# Patient Record
Sex: Male | Born: 1975 | Race: White | Hispanic: No | Marital: Married | Smoking: Current every day smoker
Health system: Southern US, Community
[De-identification: ages and names within clinical notes are randomized; demographics above are authoritative.]

## PROBLEM LIST (undated history)

## (undated) DIAGNOSIS — M1990 Unspecified osteoarthritis, unspecified site: Secondary | ICD-10-CM

## (undated) DIAGNOSIS — J449 Chronic obstructive pulmonary disease, unspecified: Secondary | ICD-10-CM

## (undated) DIAGNOSIS — G629 Polyneuropathy, unspecified: Secondary | ICD-10-CM

## (undated) HISTORY — PX: ABDOMINAL SURGERY: SHX537

---

## 2014-06-24 ENCOUNTER — Emergency Department (HOSPITAL_COMMUNITY)
Admission: EM | Admit: 2014-06-24 | Discharge: 2014-06-24 | Disposition: A | Discharge status: Home / Self Care | Payer: Self-pay | Attending: Emergency Medicine | Admitting: Emergency Medicine

## 2014-06-24 ENCOUNTER — Emergency Department (HOSPITAL_COMMUNITY): Payer: Self-pay

## 2014-06-24 ENCOUNTER — Encounter (HOSPITAL_COMMUNITY): Payer: Self-pay | Admitting: Cardiology

## 2014-06-24 DIAGNOSIS — N2889 Other specified disorders of kidney and ureter: Secondary | ICD-10-CM | POA: Insufficient documentation

## 2014-06-24 DIAGNOSIS — Y9289 Other specified places as the place of occurrence of the external cause: Secondary | ICD-10-CM | POA: Insufficient documentation

## 2014-06-24 DIAGNOSIS — Y9389 Activity, other specified: Secondary | ICD-10-CM | POA: Insufficient documentation

## 2014-06-24 DIAGNOSIS — Y998 Other external cause status: Secondary | ICD-10-CM | POA: Insufficient documentation

## 2014-06-24 DIAGNOSIS — S20222A Contusion of left back wall of thorax, initial encounter: Secondary | ICD-10-CM | POA: Insufficient documentation

## 2014-06-24 DIAGNOSIS — J159 Unspecified bacterial pneumonia: Secondary | ICD-10-CM | POA: Insufficient documentation

## 2014-06-24 DIAGNOSIS — Z720 Tobacco use: Secondary | ICD-10-CM | POA: Insufficient documentation

## 2014-06-24 DIAGNOSIS — J189 Pneumonia, unspecified organism: Secondary | ICD-10-CM

## 2014-06-24 DIAGNOSIS — W19XXXA Unspecified fall, initial encounter: Secondary | ICD-10-CM

## 2014-06-24 DIAGNOSIS — W108XXA Fall (on) (from) other stairs and steps, initial encounter: Secondary | ICD-10-CM | POA: Insufficient documentation

## 2014-06-24 LAB — I-STAT CHEM 8, ED
BUN: 8 mg/dL (ref 6–20)
CHLORIDE: 102 mmol/L (ref 101–111)
Calcium, Ion: 1.15 mmol/L (ref 1.12–1.23)
Creatinine, Ser: 1.1 mg/dL (ref 0.61–1.24)
GLUCOSE: 102 mg/dL — AB (ref 65–99)
HCT: 39 % (ref 39.0–52.0)
Hemoglobin: 13.3 g/dL (ref 13.0–17.0)
Potassium: 4.2 mmol/L (ref 3.5–5.1)
Sodium: 138 mmol/L (ref 135–145)
TCO2: 22 mmol/L (ref 0–100)

## 2014-06-24 MED ORDER — IOHEXOL 300 MG/ML  SOLN
100.0000 mL | Freq: Once | INTRAMUSCULAR | Status: AC | PRN
  Administered 2014-06-24: 100 mL via INTRAVENOUS

## 2014-06-24 MED ORDER — AZITHROMYCIN 250 MG PO TABS: 250.0000 mg | ORAL_TABLET | Freq: Every day | ORAL | Status: DC

## 2014-06-24 MED ORDER — KETOROLAC TROMETHAMINE 30 MG/ML IJ SOLN
60.0000 mg | Freq: Once | INTRAMUSCULAR | Status: AC
  Administered 2014-06-24: 60 mg via INTRAMUSCULAR
  Filled 2014-06-24: qty 2

## 2014-06-24 MED ORDER — NAPROXEN 500 MG PO TABS: 500.0000 mg | ORAL_TABLET | Freq: Two times a day (BID) | ORAL | Status: DC

## 2014-06-24 NOTE — ED Provider Notes (Signed)
CSN: [id number]     Arrival date & time 06/24/14  1038 History  This chart was scribed for non-physician practitioner Jaynie Crumbleatyana Hien Cunliffe, PA-C working with Azalia BilisKevin Campos, MD by Littie Deedsichard Sun, ED Scribe. This patient was seen in room TR07C/TR07C and the patient's care was started at 11:20 AM.     Chief Complaint  Patient presents with  . Fall  . Back Pain   The history is provided by the patient. No language interpreter was used.    HPI Comments: Randy Mann is a 39 y.o. male who presents to the Emergency Department complaining of a fall that occurred about 1.5 hours ago. He reports tripping over an air hose while at work and falling down about 4 steps, hitting his back and tailbone against the steps. He states this knocked the wind out of him. Patient reports having associated throbbing, non-radiating back pain from his mid-back to his lower back. Wearing his tool belt, which weighs about 25 lbs, makes the pain worse. Patient denies headache and SOB.  History reviewed. No pertinent past medical history. History reviewed. No pertinent past surgical history. No family history on file. History  Substance Use Topics  . Smoking status: Current Every Day Smoker  . Smokeless tobacco: Not on file  . Alcohol Use: Yes    Review of Systems  Respiratory: Negative for shortness of breath.   Musculoskeletal: Positive for back pain.  Neurological: Negative for headaches.      Allergies  Review of patient's allergies indicates no known allergies.  Home Medications   Prior to Admission medications   Not on File   BP 117/81 mmHg  Pulse 97  Temp(Src) 98.7 F (37.1 C) (Oral)  Resp 16  Ht 6\' 3"  (1.905 m)  Wt 250 lb (113.399 kg)  BMI 31.25 kg/m2  SpO2 96%   Physical Exam  Constitutional: He is oriented to person, place, and time. He appears well-developed and well-nourished. No distress.  HENT:  Head: Normocephalic and atraumatic.  Mouth/Throat: Oropharynx is clear and moist. No  oropharyngeal exudate.  Eyes: Conjunctivae and EOM are normal. Pupils are equal, round, and reactive to light.  Neck: Neck supple.  Cardiovascular: Normal rate and regular rhythm.   Pulmonary/Chest: Effort normal and breath sounds normal. No respiratory distress. He has no wheezes. He has no rales.  Tenderness to the posterior left lower ribs.  Abdominal: Soft. Bowel sounds are normal. He exhibits no distension. There is tenderness. There is no rebound.  Left upper quadrant tenderness  Musculoskeletal: He exhibits no edema.  Midline tenderness to palpation over lumbar spine midline, there is swelling and bruising to the left lumbar paravertebral area. Tenderness extends into the sacrum and coccyx.  Neurological: He is alert and oriented to person, place, and time. No cranial nerve deficit.  5/5 and equal lower extremity strength. 2+ and equal patellar reflexes bilaterally. Pt able to dorsiflex bilateral toes and feet with good strength against resistance. Equal sensation bilaterally over thighs and lower legs.   Skin: Skin is warm and dry. No rash noted.  Psychiatric: He has a normal mood and affect. His behavior is normal.  Nursing note and vitals reviewed.   ED Course  Procedures  DIAGNOSTIC STUDIES: Oxygen Saturation is 96% on room air, adequate by my interpretation.    COORDINATION OF CARE: 11:25 AM-Discussed treatment plan which includes XR imaging and Toradol injection with patient/guardian at bedside and patient/guardian agreed to plan. Patient declines narcotic pain medication.   Labs Review Labs Reviewed - No  data to display  Imaging Review No results found.   EKG Interpretation None      MDM   Final diagnoses:  Fall, initial encounter  Back contusion, left, initial encounter  CAP (community acquired pneumonia)  Left renal mass    patient is here after a fall down couple of steps. Complaining of left flank pain, left sacrum and coccyx pain, lower back pain. He is  neurovascularly intact. He is also having some left upper quadrant abdominal tenderness. Initially obtained x-rays which are unremarkable. I followed up with a CT scan of abdomen and pelvis given the significant tenderness in the left abdomen and it was worsening with patient being in the ER. His CT is negative for acute injury, however they did see possible pneumonia in the lingula. Also incidental finding of a mass behind left kidney. Discussed results with patient. Patient affect has been coughing for the last week and had a fever up to 101 over the weekend. I will treat her with azithromycin for the pneumonia. Home with NSAIDs, patient does not want anything stronger for pain. Follow-up with primary care doctor.  Filed Vitals:   06/24/14 1058 06/24/14 1412 06/24/14 1521  BP: 117/81 113/60 115/88  Pulse: 97 90 88  Temp: 98.7 F (37.1 C) 97.9 F (36.6 C) 98 F (36.7 C)  TempSrc: Oral Oral   Resp: Height:  (1.905 m)    Weight: 250 lb (113.399 kg)    SpO2: 96% 98% 99%     I personally performed the services described in this documentation, which was scribed in my presence. The recorded information has been reviewed and is accurate.    Jaynie Crumble, PA-C 06/24/14 1621  Azalia Bilis, MD 06/25/14 [phone]

## 2014-06-24 NOTE — Discharge Instructions (Signed)
Your CT scan showed possible pneumonia and a mass behind left kidney. Please take zithromax as prescribed until all gone for this infection. Take naproxen as prescribed as needed for pain. Ice the swollen area of your back several times a day. Please follow with a primary care doctor for recheck and for further evaluation of the mass that was seen on the CT scan.  Hematoma A hematoma is a collection of blood under the skin, in an organ, in a body space, in a joint space, or in other tissue. The blood can clot to form a lump that you can see and feel. The lump is often firm and may sometimes become sore and tender. Most hematomas get better in a few days to weeks. However, some hematomas may be serious and require medical care. Hematomas can range in size from very small to very large. CAUSES  A hematoma can be caused by a blunt or penetrating injury. It can also be caused by spontaneous leakage from a blood vessel under the skin. Spontaneous leakage from a blood vessel is more likely to occur in older people, especially those taking blood thinners. Sometimes, a hematoma can develop after certain medical procedures. SIGNS AND SYMPTOMS   A firm lump on the body.  Possible pain and tenderness in the area.  Bruising.Blue, dark blue, purple-red, or yellowish skin may appear at the site of the hematoma if the hematoma is close to the surface of the skin. For hematomas in deeper tissues or body spaces, the signs and symptoms may be subtle. For example, an intra-abdominal hematoma may cause abdominal pain, weakness, fainting, and shortness of breath. An intracranial hematoma may cause a headache or symptoms such as weakness, trouble speaking, or a change in consciousness. DIAGNOSIS  A hematoma can usually be diagnosed based on your medical history and a physical exam. Imaging tests may be needed if your health care provider suspects a hematoma in deeper tissues or body spaces, such as the abdomen, head, or  chest. These tests may include ultrasonography or a CT scan.  TREATMENT  Hematomas usually go away on their own over time. Rarely does the blood need to be drained out of the body. Large hematomas or those that may affect vital organs will sometimes need surgical drainage or monitoring. HOME CARE INSTRUCTIONS   Apply ice to the injured area:   Put ice in a plastic bag.   Place a towel between your skin and the bag.   Leave the ice on for 20 minutes, 2-3 times a day for the first 1 to 2 days.   After the first 2 days, switch to using warm compresses on the hematoma.   Elevate the injured area to help decrease pain and swelling. Wrapping the area with an elastic bandage may also be helpful. Compression helps to reduce swelling and promotes shrinking of the hematoma. Make sure the bandage is not wrapped too tight.   If your hematoma is on a lower extremity and is painful, crutches may be helpful for a couple days.   Only take over-the-counter or prescription medicines as directed by your health care provider. SEEK IMMEDIATE MEDICAL CARE IF:   You have increasing pain, or your pain is not controlled with medicine.   You have a fever.   You have worsening swelling or discoloration.   Your skin over the hematoma breaks or starts bleeding.   Your hematoma is in your chest or abdomen and you have weakness, shortness of breath, or a  change in consciousness.  Your hematoma is on your scalp (caused by a fall or injury) and you have a worsening headache or a change in alertness or consciousness. MAKE SURE YOU:   Understand these instructions.  Will watch your condition.  Will get help right away if you are not doing well or get worse. Document Released: 09/01/2003 Document Revised: 09/19/2012 Document Reviewed: 06/27/2012 [facility] Patient Information 2015 [place]. This information is not intended to replace advice given to you by your health care provider. Make sure  you discuss any questions you have with your health care provider.   Pneumonia Pneumonia is an infection of the lungs.  CAUSES Pneumonia may be caused by bacteria or a virus. Usually, these infections are caused by breathing infectious particles into the lungs (respiratory tract). SIGNS AND SYMPTOMS   Cough.  Fever.  Chest pain.  Increased rate of breathing.  Wheezing.  Mucus production. DIAGNOSIS  If you have the common symptoms of pneumonia, your health care provider will typically confirm the diagnosis with a chest X-ray. The X-ray will show an abnormality in the lung (pulmonary infiltrate) if you have pneumonia. Other tests of your blood, urine, or sputum may be done to find the specific cause of your pneumonia. Your health care provider may also do tests (blood gases or pulse oximetry) to see how well your lungs are working. TREATMENT  Some forms of pneumonia may be spread to other people when you cough or sneeze. You may be asked to wear a mask before and during your exam. Pneumonia that is caused by bacteria is treated with antibiotic medicine. Pneumonia that is caused by the influenza virus may be treated with an antiviral medicine. Most other viral infections must run their course. These infections will not respond to antibiotics.  HOME CARE INSTRUCTIONS   Cough suppressants may be used if you are losing too much rest. However, coughing protects you by clearing your lungs. You should avoid using cough suppressants if you can.  Your health care provider may have prescribed medicine if he or she thinks your pneumonia is caused by bacteria or influenza. Finish your medicine even if you start to feel better.  Your health care provider may also prescribe an expectorant. This loosens the mucus to be coughed up.  Take medicines only as directed by your health care provider.  Do not smoke. Smoking is a common cause of bronchitis and can contribute to pneumonia. If you are a smoker  and continue to smoke, your cough may last several weeks after your pneumonia has cleared.  A cold steam vaporizer or humidifier in your room or home may help loosen mucus.  Coughing is often worse at night. Sleeping in a semi-upright position in a recliner or using a couple pillows under your head will help with this.  Get rest as you feel it is needed. Your body will usually let you know when you need to rest. PREVENTION A pneumococcal shot (vaccine) is available to prevent a common bacterial cause of pneumonia. This is usually suggested for:  People over 41 years old.  Patients on chemotherapy.  People with chronic lung problems, such as bronchitis or emphysema.  People with immune system problems. If you are over 65 or have a high risk condition, you may receive the pneumococcal vaccine if you have not received it before. In some countries, a routine influenza vaccine is also recommended. This vaccine can help prevent some cases of pneumonia.You may be offered the influenza vaccine  as part of your care. If you smoke, it is time to quit. You may receive instructions on how to stop smoking. Your health care provider can provide medicines and counseling to help you quit. SEEK MEDICAL CARE IF: You have a fever. SEEK IMMEDIATE MEDICAL CARE IF:   Your illness becomes worse. This is especially true if you are elderly or weakened from any other disease.  You cannot control your cough with suppressants and are losing sleep.  You begin coughing up blood.  You develop pain which is getting worse or is uncontrolled with medicines.  Any of the symptoms which initially brought you in for treatment are getting worse rather than better.  You develop shortness of breath or chest pain. MAKE SURE YOU:   Understand these instructions.  Will watch your condition.  Will get help right away if you are not doing well or get worse. Document Released: 01/17/2005 Document Revised: 06/03/2013  Document Reviewed: 04/08/2010 [facility] Patient Information 2015 [place], [place]. This information is not intended to replace advice given to you by your health care provider. Make sure you discuss any questions you have with your health care provider.

## 2014-06-24 NOTE — ED Notes (Signed)
Pt reports he tripped over an air hose this morning and fell down some stairs. Reports lower back pain. States he hit his tailbone on the corner of one step.

## 2015-12-08 ENCOUNTER — Encounter: Payer: Self-pay | Admitting: Emergency Medicine

## 2015-12-08 ENCOUNTER — Emergency Department
Admission: EM | Admit: 2015-12-08 | Discharge: 2015-12-08 | Disposition: A | Discharge status: Home / Self Care | Payer: Self-pay | Attending: Emergency Medicine | Admitting: Emergency Medicine

## 2015-12-08 DIAGNOSIS — Z791 Long term (current) use of non-steroidal anti-inflammatories (NSAID): Secondary | ICD-10-CM | POA: Insufficient documentation

## 2015-12-08 DIAGNOSIS — R600 Localized edema: Secondary | ICD-10-CM | POA: Insufficient documentation

## 2015-12-08 DIAGNOSIS — F17200 Nicotine dependence, unspecified, uncomplicated: Secondary | ICD-10-CM | POA: Insufficient documentation

## 2015-12-08 DIAGNOSIS — M79671 Pain in right foot: Secondary | ICD-10-CM

## 2015-12-08 DIAGNOSIS — M79672 Pain in left foot: Secondary | ICD-10-CM

## 2015-12-08 DIAGNOSIS — Z792 Long term (current) use of antibiotics: Secondary | ICD-10-CM | POA: Insufficient documentation

## 2015-12-08 LAB — GLUCOSE, CAPILLARY: Glucose-Capillary: 122 mg/dL — ABNORMAL HIGH (ref 65–99)

## 2015-12-08 MED ORDER — PREDNISONE 10 MG (21) PO TBPK: 10.0000 mg | ORAL_TABLET | Freq: Every day | ORAL | 0 refills | Status: DC

## 2015-12-08 NOTE — ED Triage Notes (Signed)
Pt reports injured right foot on Friday and is now having swelling to right ankle. Pt reports hit toe on right foot in door jam. Pt reports unable to bear weight on foot. No swelling or deformity noted to right ankle.

## 2015-12-08 NOTE — ED Provider Notes (Signed)
[facility] Emergency [facility] Provider Note   ____________________________________________   I have reviewed the triage vital signs and the nursing notes.   HISTORY  Chief Complaint Bilateral foot pain  History limited by: Not Limited   HPI Randy Mann is a 40 y.o. male who presents to the emergency department today because of concern for bilateral foot pain and swelling. Patient states that these symptoms have been on and off for the past three years. He does not have a primary care doctor. A few days ago the patient stubbed a toe on his right foot. He thinks that the swelling and pain started to get worse on the left foot because he was favoring it, however now he feels he is having swelling on both feet. States he does have a history of gout. The patient denies any fevers, nausea or vomiting.   History reviewed. No pertinent past medical history.  There are no active problems to display for this patient.   Past Surgical History:  Procedure Laterality Date  . ABDOMINAL SURGERY      Prior to Admission medications   Medication Sig Start Date End Date Taking? Authorizing Provider  azithromycin (ZITHROMAX) 250 MG tablet Take 1 tablet (250 mg total) by mouth daily. Take first 2 tablets together, then 1 every day until finished. 06/24/14   Tatyana Kirichenko, PA-C  naproxen (NAPROSYN) 500 MG tablet Take 1 tablet (500 mg total) by mouth 2 (two) times daily. 06/24/14   Jaynie Crumbleatyana Kirichenko, PA-C    Allergies Patient has no known allergies.  No family history on file.  Social History Social History  Substance Use Topics  . Smoking status: Current Every Day Smoker    Packs/day: 1.00  . Smokeless tobacco: Never Used  . Alcohol use Yes    Review of Systems  Constitutional: Negative for fever. Cardiovascular: Negative for chest pain. Respiratory: Negative for shortness of breath. Gastrointestinal: Negative for abdominal pain, vomiting and  diarrhea. Genitourinary: Negative for dysuria. Musculoskeletal: Negative for back pain. Positive for bilateral foot pain. Skin: Negative for rash. Neurological: Negative for headaches, focal weakness or numbness.  10-point ROS otherwise negative.  ____________________________________________   PHYSICAL EXAM:  VITAL SIGNS: ED Triage Vitals  Enc Vitals Group     BP 12/08/15 0455 100/65     Pulse Rate 12/08/15 0455 94     Resp 12/08/15 0455 18     Temp 12/08/15 0455 97.8 F (36.6 C)     Temp Source 12/08/15 0455 Oral     SpO2 12/08/15 0455 96 %     Weight 12/08/15 0455 257 lb (116.6 kg)     Height 12/08/15 0455 6\' 3"  (1.905 m)     Head Circumference --      Peak Flow --      Pain Score 12/08/15 0456 9   Constitutional: Alert and oriented. Well appearing and in no distress. Eyes: Conjunctivae are normal. Normal extraocular movements. ENT   Head: Normocephalic and atraumatic.   Nose: No congestion/rhinnorhea.   Mouth/Throat: Mucous membranes are moist.   Neck: No stridor. Hematological/Lymphatic/Immunilogical: No cervical lymphadenopathy. Cardiovascular: Normal rate, regular rhythm.  No murmurs, rubs, or gallops.  Respiratory: Normal respiratory effort without tachypnea nor retractions. Breath sounds are clear and equal bilaterally. No wheezes/rales/rhonchi. Gastrointestinal: Soft and nontender. No distention.  Genitourinary: Deferred Musculoskeletal: Normal range of motion in all extremities. Trace bilateral pitting edema, some swelling to bilateral feet. Minor. Patient has a slight amount of erythema and warmth over the great PIP  bilaterally. Neurologic:  Normal speech and language. No gross focal neurologic deficits are appreciated.  Skin:  Skin is warm, dry and intact. No rash noted. Psychiatric: Mood and affect are normal. Speech and behavior are normal. Patient exhibits appropriate insight and judgment.  ____________________________________________     LABS (pertinent positives/negatives)  None  ____________________________________________   EKG  None  ____________________________________________    RADIOLOGY  None  ____________________________________________   PROCEDURES  Procedures  ____________________________________________   INITIAL IMPRESSION / ASSESSMENT AND PLAN / ED COURSE  Pertinent labs & imaging results that were available during my care of the patient were reviewed by me and considered in my medical decision making (see chart for details).  Patient with bilateral foot swelling and pain. Chronic issue, although there is some erythema and warmth to PIPs. Will treat presumptively for gout. Additionally discussed importance of primary care - will give clinic info. Discussed return precautions.   ____________________________________________   FINAL CLINICAL IMPRESSION(S) / ED DIAGNOSES  Final diagnoses:  Foot pain, bilateral     Note: This dictation was prepared with Dragon dictation. Any transcriptional errors that result from this process are unintentional    Phineas SemenGraydon Zandrea Kenealy, MD 12/08/15 [id number]

## 2015-12-08 NOTE — ED Notes (Signed)
ED Provider at bedside. 

## 2015-12-08 NOTE — Discharge Instructions (Signed)
Please seek medical attention for any high fevers, chest pain, shortness of breath, change in behavior, persistent vomiting, bloody stool or any other new or concerning symptoms.  

## 2015-12-08 NOTE — ED Notes (Signed)
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  I will run a CBG to check his sugar.  He stated he hit his right foot in the door of his place 2 nights ago and in addition to that pain he is starting to have left ankle pain where he is "compensating for his right foot pain".

## 2015-12-13 ENCOUNTER — Ambulatory Visit (HOSPITAL_COMMUNITY)
Admission: EM | Admit: 2015-12-13 | Discharge: 2015-12-13 | Disposition: A | Discharge status: Home / Self Care | Payer: Self-pay | Attending: Family Medicine | Admitting: Family Medicine

## 2015-12-13 ENCOUNTER — Encounter (HOSPITAL_COMMUNITY): Payer: Self-pay | Admitting: *Deleted

## 2015-12-13 DIAGNOSIS — M10071 Idiopathic gout, right ankle and foot: Secondary | ICD-10-CM

## 2015-12-13 MED ORDER — PREDNISONE 5 MG PO TABS: ORAL_TABLET | ORAL | 0 refills | Status: DC

## 2015-12-13 MED ORDER — KETOROLAC TROMETHAMINE 60 MG/2ML IM SOLN
60.0000 mg | Freq: Once | INTRAMUSCULAR | Status: AC
  Administered 2015-12-13: 60 mg via INTRAMUSCULAR

## 2015-12-13 MED ORDER — KETOROLAC TROMETHAMINE 60 MG/2ML IM SOLN
INTRAMUSCULAR | Status: AC
  Filled 2015-12-13: qty 2

## 2015-12-13 NOTE — ED Triage Notes (Signed)
C/O right foot pain.  Assessment per PA.  CMS intact.

## 2015-12-13 NOTE — Discharge Instructions (Signed)
You probably have gout though other types of artritidies cannot be ruled out, either way prednisone will help your pain and swelling. Must f/u with  a PCP who can investigate this more and help with your long term management. Watch diet that includes BEER, Shellfish or fructose syrup as this can worsen your symtpoms.

## 2015-12-13 NOTE — ED Provider Notes (Signed)
CSN: [id number]     Arrival date & time 12/13/15  1654 History   First MD Initiated Contact with Patient 12/13/15 1804     Chief Complaint  Patient presents with  . Foot Pain   (Consider location/radiation/quality/duration/timing/severity/associated sxs/prior Treatment) [age] yo presents with right ankle and foot pain and swelling. No injury. He carries a history of gout but reports that he does not follow with a PCP and never been treated. He notes that these flares are coming more frequent and they are "Very painful". He has not noticed a food trigger, but a lot of walking and standing may trigger a flare. Prednisone is helpful. He was last seen in the ED and told to wear support hose". He denies fever or chills.       History reviewed. No pertinent past medical history. Past Surgical History:  Procedure Laterality Date  . ABDOMINAL SURGERY     No family history on file. Social History  Substance Use Topics  . Smoking status: Current Every Day Smoker    Packs/day: 1.00  . Smokeless tobacco: Never Used  . Alcohol use 25.2 oz/week    42 Cans of beer per week    Review of Systems  Constitutional: Negative for fatigue and fever.  Musculoskeletal: Positive for arthralgias.    Allergies  Patient has no known allergies.  Home Medications   Prior to Admission medications   Medication Sig Start Date End Date Taking? Authorizing Provider  azithromycin (ZITHROMAX) 250 MG tablet Take 1 tablet (250 mg total) by mouth daily. Take first 2 tablets together, then 1 every day until finished. 06/24/14   Tatyana Kirichenko, PA-C  naproxen (NAPROSYN) 500 MG tablet Take 1 tablet (500 mg total) by mouth 2 (two) times daily. 06/24/14   Tatyana Kirichenko, PA-C  predniSONE (DELTASONE) 5 MG tablet 6 tablets po q d x 2 days, then 5 tabs po x 2 days, then 4 tabs po x 2 days, then 3 tabs po x 2 days, then 2 tabs po x 2 days then 1 tab po x 2 days then stop 12/13/15   Riki SheerMichelle G Valerya Maxton, PA-C   Meds  Ordered and Administered this Visit   Medications  ketorolac (TORADOL) injection 60 mg (60 mg Intramuscular Given 12/13/15 1811)    BP 124/84   Pulse 91   Temp 97.9 F (36.6 C) (Oral)   Resp 16   SpO2 94%  No data found.   Physical Exam  Constitutional: He is oriented to person, place, and time. He appears well-developed and well-nourished. No distress.  Musculoskeletal:  Right lateral ankle and right great toe with erythema, warmth and associated edema, tender to palpation and ROM. No rashes are noted, full ROM  Neurological: He is alert and oriented to person, place, and time.  Skin: Skin is warm. He is not diaphoretic.  Nursing note and vitals reviewed.   Urgent Care Course   Clinical Course     Procedures (including critical care time)  Labs Review Labs Reviewed - No data to display  Imaging Review No results found.   Visual Acuity Review  Right Eye Distance:   Left Eye Distance:   Bilateral Distance:    Right Eye Near:   Left Eye Near:    Bilateral Near:         MDM   1. Acute idiopathic gout of right ankle    Patient's exam and presentation appear suspicious for gout or an inflammatory arthritis. No signs of infection is noted.  Given the severity of pain suspect gout. We discussed long term management with a PCP and how it was crucial to establish care with one. Acutely treat with Prednisone as colchicine is expensive and will have a financial restraint. Toradol is given acutely for pain. Information given on gout and appropriate f/u. F/U as needed.     Riki SheerMichelle G Chai Verdejo, PA-C 12/13/15 1843

## 2016-01-13 ENCOUNTER — Emergency Department (HOSPITAL_COMMUNITY)
Admission: EM | Admit: 2016-01-13 | Discharge: 2016-01-13 | Disposition: A | Discharge status: Home / Self Care | Payer: Self-pay | Attending: Emergency Medicine | Admitting: Emergency Medicine

## 2016-01-13 ENCOUNTER — Encounter (HOSPITAL_COMMUNITY): Payer: Self-pay | Admitting: *Deleted

## 2016-01-13 ENCOUNTER — Emergency Department (HOSPITAL_COMMUNITY): Payer: Self-pay

## 2016-01-13 DIAGNOSIS — J069 Acute upper respiratory infection, unspecified: Secondary | ICD-10-CM | POA: Insufficient documentation

## 2016-01-13 DIAGNOSIS — F17200 Nicotine dependence, unspecified, uncomplicated: Secondary | ICD-10-CM | POA: Insufficient documentation

## 2016-01-13 NOTE — Discharge Instructions (Signed)
Please read attached information. If you experience any new or worsening signs or symptoms please return to the emergency room for evaluation. Please follow-up with your primary care provider or specialist as discussed.  °

## 2016-01-13 NOTE — ED Provider Notes (Signed)
[facility] DEPT Provider Note   CSN: [id number] Arrival date & time: 01/13/16  0820     History   Chief Complaint Chief Complaint  Patient presents with  . Cough  . Weakness  . Fever    HPI Randy Mann is a 40 y.o. male.  HPI   40 year old male presents today with complaints of cough and weakness. Patient reports that 6 days ago he started developing swelling to his cervical lymph nodes. 2 days later he did the takeoff upper respiratory congestion and fever. He reports the fever is no longer present, but continues to have cough and upper respiratory congestion. Patient notes the coughing is worse at night. He denies any shortness of breath chest pain, fever, lotion swelling or edema, sore throat or difficulty swallowing.    History reviewed. No pertinent past medical history.  There are no active problems to display for this patient.   Past Surgical History:  Procedure Laterality Date  . ABDOMINAL SURGERY         Home Medications    Prior to Admission medications   Medication Sig Start Date End Date Taking? Authorizing Provider  azithromycin (ZITHROMAX) 250 MG tablet Take 1 tablet (250 mg total) by mouth daily. Take first 2 tablets together, then 1 every day until finished. 06/24/14   Tatyana Kirichenko, PA-C  naproxen (NAPROSYN) 500 MG tablet Take 1 tablet (500 mg total) by mouth 2 (two) times daily. 06/24/14   Tatyana Kirichenko, PA-C  predniSONE (DELTASONE) 5 MG tablet 6 tablets po q d x 2 days, then 5 tabs po x 2 days, then 4 tabs po x 2 days, then 3 tabs po x 2 days, then 2 tabs po x 2 days then 1 tab po x 2 days then stop 12/13/15   Riki SheerMichelle G Young, PA-C    Family History History reviewed. No pertinent family history.  Social History Social History  Substance Use Topics  . Smoking status: Current Every Day Smoker    Packs/day: 1.00  . Smokeless tobacco: Never Used  . Alcohol use 25.2 oz/week    42 Cans of beer per week     Allergies   Patient  has no known allergies.   Review of Systems Review of Systems  All other systems reviewed and are negative.    Physical Exam Updated Vital Signs BP 127/87 (BP Location: Right Arm)   Pulse 87   Temp 97.9 F (36.6 C) (Oral)   Resp 18   SpO2 97%   Physical Exam  Constitutional: He is oriented to person, place, and time. He appears well-developed and well-nourished.  HENT:  Head: Normocephalic and atraumatic.  Right Ear: Hearing, tympanic membrane and external ear normal.  Left Ear: Hearing, tympanic membrane and external ear normal.  Mouth/Throat: Uvula is midline and oropharynx is clear and moist.  Eyes: Conjunctivae are normal. Pupils are equal, round, and reactive to light. Right eye exhibits no discharge. Left eye exhibits no discharge. No scleral icterus.  Neck: Normal range of motion. No JVD present. No tracheal deviation present.  Cardiovascular: Normal rate, regular rhythm, normal heart sounds and intact distal pulses.   No murmur heard. Pulmonary/Chest: Effort normal and breath sounds normal. No stridor. No respiratory distress. He has no wheezes. He has no rales.  Musculoskeletal: He exhibits no edema.  Neurological: He is alert and oriented to person, place, and time. Coordination normal.  Psychiatric: He has a normal mood and affect. His behavior is normal. Judgment and thought content normal.  Nursing note and vitals reviewed.    ED Treatments / Results  Labs (all labs ordered are listed, but only abnormal results are displayed) Labs Reviewed - No data to display  EKG  EKG Interpretation None       Radiology Dg Chest 2 View  Result Date: 01/13/2016 CLINICAL DATA:  Shortness of Breath EXAM: CHEST  2 VIEW COMPARISON:  06/24/2014 FINDINGS: Cardiomediastinal silhouette is stable. No infiltrate or pleural effusion. No pulmonary edema. Stable chronic mild interstitial prominence. Bony thorax is unremarkable. IMPRESSION: No active cardiopulmonary disease.  Electronically Signed   By: Natasha MeadLiviu  Pop M.D.   On: 01/13/2016 08:53    Procedures Procedures (including critical care time)  Medications Ordered in ED Medications - No data to display   Initial Impression / Assessment and Plan / ED Course  I have reviewed the triage vital signs and the nursing notes.  Pertinent labs & imaging results that were available during my care of the patient were reviewed by me and considered in my medical decision making (see chart for details).  Clinical Course      Final Clinical Impressions(s) / ED Diagnoses   Final diagnoses:  Viral upper respiratory tract infection    Labs:  Imaging: DG chest 2 view  Consults:  Therapeutics:  Discharge Meds:   Assessment/Plan:   40 year old male presents today with likely viral URI. Patient has no acute signs of bacterial infection, is afebrile and nontoxic. Patient has clear lung sounds and a negative chest x-ray. Will be discharged home with symptomatic care instructions and strict return precautions. He verbalizes understanding and agreement to today's plan had no further questions or concerns at time of discharge   New Prescriptions Discharge Medication List as of 01/13/2016 10:40 AM       Eyvonne MechanicJeffrey Miasia Crabtree, PA-C 01/13/16 1158    Benjiman CoreNathan Pickering, MD 01/13/16 [phone]

## 2016-01-13 NOTE — ED Triage Notes (Signed)
Pt reports onset of cold symptoms on Thursday with fever, sore throat. Reports still having wheezing/cough, generalized fatigue, some diarrhea. No acute distress is noted at triage.

## 2016-01-21 ENCOUNTER — Emergency Department (HOSPITAL_COMMUNITY): Payer: Self-pay

## 2016-01-21 ENCOUNTER — Encounter (HOSPITAL_COMMUNITY): Payer: Self-pay

## 2016-01-21 ENCOUNTER — Emergency Department (HOSPITAL_COMMUNITY)
Admission: EM | Admit: 2016-01-21 | Discharge: 2016-01-21 | Disposition: A | Discharge status: Home / Self Care | Payer: Self-pay | Attending: Emergency Medicine | Admitting: Emergency Medicine

## 2016-01-21 DIAGNOSIS — F17200 Nicotine dependence, unspecified, uncomplicated: Secondary | ICD-10-CM | POA: Insufficient documentation

## 2016-01-21 DIAGNOSIS — F10920 Alcohol use, unspecified with intoxication, uncomplicated: Secondary | ICD-10-CM

## 2016-01-21 DIAGNOSIS — R55 Syncope and collapse: Secondary | ICD-10-CM | POA: Insufficient documentation

## 2016-01-21 DIAGNOSIS — Z79899 Other long term (current) drug therapy: Secondary | ICD-10-CM | POA: Insufficient documentation

## 2016-01-21 DIAGNOSIS — R569 Unspecified convulsions: Secondary | ICD-10-CM

## 2016-01-21 DIAGNOSIS — F10129 Alcohol abuse with intoxication, unspecified: Secondary | ICD-10-CM | POA: Insufficient documentation

## 2016-01-21 LAB — URINALYSIS, ROUTINE W REFLEX MICROSCOPIC
Bilirubin Urine: NEGATIVE
GLUCOSE, UA: NEGATIVE mg/dL
HGB URINE DIPSTICK: NEGATIVE
Ketones, ur: NEGATIVE mg/dL
Leukocytes, UA: NEGATIVE
Nitrite: NEGATIVE
PROTEIN: NEGATIVE mg/dL
SPECIFIC GRAVITY, URINE: 1.001 — AB (ref 1.005–1.030)
pH: 6 (ref 5.0–8.0)

## 2016-01-21 LAB — COMPREHENSIVE METABOLIC PANEL
ALT: 29 U/L (ref 17–63)
AST: 25 U/L (ref 15–41)
Albumin: 4.1 g/dL (ref 3.5–5.0)
Alkaline Phosphatase: 61 U/L (ref 38–126)
Anion gap: 12 (ref 5–15)
BUN: 6 mg/dL (ref 6–20)
CHLORIDE: 103 mmol/L (ref 101–111)
CO2: 23 mmol/L (ref 22–32)
Calcium: 9.2 mg/dL (ref 8.9–10.3)
Creatinine, Ser: 0.84 mg/dL (ref 0.61–1.24)
GFR calc non Af Amer: >60 mL/min (ref >60)
Glucose, Bld: 79 mg/dL (ref 65–99)
Potassium: 3.7 mmol/L (ref 3.5–5.1)
SODIUM: 138 mmol/L (ref 135–145)
Total Bilirubin: 0.3 mg/dL (ref 0.3–1.2)
Total Protein: 6.7 g/dL (ref 6.5–8.1)

## 2016-01-21 LAB — CBC WITH DIFFERENTIAL/PLATELET
Basophils Absolute: 0.1 10*3/uL (ref 0.0–0.1)
Basophils Relative: 1 %
EOS PCT: 2 %
Eosinophils Absolute: 0.2 10*3/uL (ref 0.0–0.7)
HCT: 38.6 % — ABNORMAL LOW (ref 39.0–52.0)
Hemoglobin: 13.2 g/dL (ref 13.0–17.0)
Lymphocytes Relative: 37 %
Lymphs Abs: 2.8 10*3/uL (ref 0.7–4.0)
MCH: 30.8 pg (ref 26.0–34.0)
MCHC: 34.2 g/dL (ref 30.0–36.0)
MCV: 90.2 fL (ref 78.0–100.0)
Monocytes Absolute: 0.5 10*3/uL (ref 0.1–1.0)
Monocytes Relative: 6 %
Neutro Abs: 4.1 10*3/uL (ref 1.7–7.7)
Neutrophils Relative %: 54 %
PLATELETS: 347 10*3/uL (ref 150–400)
RBC: 4.28 MIL/uL (ref 4.22–5.81)
RDW: 13.4 % (ref 11.5–15.5)
WBC: 7.6 10*3/uL (ref 4.0–10.5)

## 2016-01-21 LAB — RAPID URINE DRUG SCREEN, HOSP PERFORMED
AMPHETAMINES: NOT DETECTED
BARBITURATES: NOT DETECTED
Benzodiazepines: NOT DETECTED
Cocaine: NOT DETECTED
Opiates: NOT DETECTED
TETRAHYDROCANNABINOL: NOT DETECTED

## 2016-01-21 LAB — ETHANOL: Alcohol, Ethyl (B): 156 mg/dL — ABNORMAL HIGH (ref <5)

## 2016-01-21 NOTE — ED Triage Notes (Signed)
Patient comes by EMS for a possible seizure with no history of them.  Patient states it was more anxiety than anything.  Lungs sounded junky for EMS with saturations in the mid 90s.  Patient did not want to be transported but patient is unable stand by himself.  Patient is unable to tell us correct day.  CBG was 100 with possible ETOH onboard.

## 2016-01-21 NOTE — Discharge Instructions (Signed)
Significantly reduce your alcohol intake.  Follow-up with neurology. [facility] been provided in this discharge summary for you to call and make these arrangements.  Return to the emergency department if symptoms worsen or change.

## 2016-01-21 NOTE — ED Provider Notes (Signed)
[facility] Provider Note   CSN: [id number] Arrival date & time: 01/21/16  1858     History   Chief Complaint Chief Complaint  Patient presents with  . Seizures  . Anxiety    HPI Patrick Jupiterrthur Luster is a 40 y.o. male.  Patient is a 40 year old male with no significant past medical history. He presents for evaluation of possible seizure. He reports riding home in the car with his girlfriend. She spoke to him and he did not respond. When she glanced over he appeared to be staring ahead and then had several shaking episodes and appeared to lose consciousness. Girlfriend reports that he does consume multiple alcoholic beverages per day and does believe that he has been drinking this afternoon. The patient adds little to history as he does not want to respond to my questions.      History reviewed. No pertinent past medical history.  There are no active problems to display for this patient.   Past Surgical History:  Procedure Laterality Date  . ABDOMINAL SURGERY         Home Medications    Prior to Admission medications   Medication Sig Start Date End Date Taking? Authorizing Provider  azithromycin (ZITHROMAX) 250 MG tablet Take 1 tablet (250 mg total) by mouth daily. Take first 2 tablets together, then 1 every day until finished. 06/24/14   Tatyana Kirichenko, PA-C  naproxen (NAPROSYN) 500 MG tablet Take 1 tablet (500 mg total) by mouth 2 (two) times daily. 06/24/14   Tatyana Kirichenko, PA-C  predniSONE (DELTASONE) 5 MG tablet 6 tablets po q d x 2 days, then 5 tabs po x 2 days, then 4 tabs po x 2 days, then 3 tabs po x 2 days, then 2 tabs po x 2 days then 1 tab po x 2 days then stop 12/13/15   Riki SheerMichelle G Young, PA-C    Family History History reviewed. No pertinent family history.  Social History Social History  Substance Use Topics  . Smoking status: Current Every Day Smoker    Packs/day: 1.00  . Smokeless tobacco: Never Used  . Alcohol use 25.2 oz/week    42  Cans of beer per week     Allergies   Patient has no known allergies.   Review of Systems Review of Systems  All other systems reviewed and are negative.    Physical Exam Updated Vital Signs BP 119/82 (BP Location: Left Arm)   Pulse 89   Temp 97.5 F (36.4 C) (Oral)   Resp 16   SpO2 94%   Physical Exam  Constitutional: He is oriented to person, place, and time. He appears well-developed and well-nourished. No distress.  Patient is somewhat somnolent. The strong odor of alcohol is present. Speech is somewhat slurred.  HENT:  Head: Normocephalic and atraumatic.  Mouth/Throat: Oropharynx is clear and moist.  Neck: Normal range of motion. Neck supple.  Cardiovascular: Normal rate and regular rhythm.  Exam reveals no friction rub.   No murmur heard. Pulmonary/Chest: Effort normal and breath sounds normal. No respiratory distress. He has no wheezes. He has no rales.  Abdominal: Soft. Bowel sounds are normal. He exhibits no distension. There is no tenderness.  Musculoskeletal: Normal range of motion. He exhibits no edema.  Neurological: He is alert and oriented to person, place, and time. Coordination normal.  Patient moves all 4 extremities. He follows commands appropriately, however is somewhat sluggish to do stone. He does appear intoxicated.  Skin: Skin is warm and dry.  He is not diaphoretic.  Nursing note and vitals reviewed.    ED Treatments / Results  Labs (all labs ordered are listed, but only abnormal results are displayed) Labs Reviewed  COMPREHENSIVE METABOLIC PANEL  ETHANOL  CBC WITH DIFFERENTIAL/PLATELET  URINALYSIS, ROUTINE W REFLEX MICROSCOPIC  RAPID URINE DRUG SCREEN, HOSP PERFORMED    EKG  EKG Interpretation None       Radiology No results found.  Procedures Procedures (including critical care time)  Medications Ordered in ED Medications - No data to display   Initial Impression / Assessment and Plan / ED Course  I have reviewed the  triage vital signs and the nursing notes.  Pertinent labs & imaging results that were available during my care of the patient were reviewed by me and considered in my medical decision making (see chart for details).  Clinical Course     Patient is a 40 year old male with history of alcohol abuse. He is brought by his girlfriend for evaluation of possible seizure. She was driving him home from work when he became less responsive and she witnessed convulsion like movements. The patient tells me he is fine and was initially refusing any tests. His girlfriend was adamant that he be worked up. CT scan of the head was negative and laboratory studies were unremarkable with the exception of blood alcohol of 156. Girlfriend continues to be concerned about occasional twitching he appears to be exhibiting. I have not specifically witnessed any of these episodes, however I highly doubt these to be seizures. I have advised that he reduce his alcohol intake. He will also be given outpatient referral for neurology with whom he can follow-up. He is to return as needed for any problems.  Final Clinical Impressions(s) / ED Diagnoses   Final diagnoses:  None    New Prescriptions New Prescriptions   No medications on file     Geoffery Lyonsouglas Charday Capetillo, MD 01/21/16 2124

## 2016-10-19 ENCOUNTER — Emergency Department (HOSPITAL_COMMUNITY): Payer: Self-pay

## 2016-10-19 ENCOUNTER — Encounter (HOSPITAL_COMMUNITY): Payer: Self-pay | Admitting: *Deleted

## 2016-10-19 ENCOUNTER — Emergency Department (HOSPITAL_BASED_OUTPATIENT_CLINIC_OR_DEPARTMENT_OTHER): Admit: 2016-10-19 | Discharge: 2016-10-19 | Disposition: A | Discharge status: Home / Self Care | Payer: Self-pay

## 2016-10-19 ENCOUNTER — Emergency Department (HOSPITAL_COMMUNITY)
Admission: EM | Admit: 2016-10-19 | Discharge: 2016-10-19 | Disposition: A | Discharge status: Home / Self Care | Payer: Self-pay | Attending: Emergency Medicine | Admitting: Emergency Medicine

## 2016-10-19 DIAGNOSIS — M7989 Other specified soft tissue disorders: Secondary | ICD-10-CM

## 2016-10-19 DIAGNOSIS — R0601 Orthopnea: Secondary | ICD-10-CM | POA: Insufficient documentation

## 2016-10-19 DIAGNOSIS — F17200 Nicotine dependence, unspecified, uncomplicated: Secondary | ICD-10-CM | POA: Insufficient documentation

## 2016-10-19 DIAGNOSIS — R600 Localized edema: Secondary | ICD-10-CM | POA: Insufficient documentation

## 2016-10-19 DIAGNOSIS — R609 Edema, unspecified: Secondary | ICD-10-CM

## 2016-10-19 DIAGNOSIS — R062 Wheezing: Secondary | ICD-10-CM | POA: Insufficient documentation

## 2016-10-19 DIAGNOSIS — M25562 Pain in left knee: Secondary | ICD-10-CM | POA: Insufficient documentation

## 2016-10-19 LAB — CBC
HCT: 41.8 % (ref 39.0–52.0)
Hemoglobin: 14.1 g/dL (ref 13.0–17.0)
MCH: 30.5 pg (ref 26.0–34.0)
MCHC: 33.7 g/dL (ref 30.0–36.0)
MCV: 90.3 fL (ref 78.0–100.0)
Platelets: 359 10*3/uL (ref 150–400)
RBC: 4.63 MIL/uL (ref 4.22–5.81)
RDW: 13.7 % (ref 11.5–15.5)
WBC: 8.6 10*3/uL (ref 4.0–10.5)

## 2016-10-19 LAB — BASIC METABOLIC PANEL
Anion gap: 15 (ref 5–15)
BUN: 5 mg/dL — AB (ref 6–20)
CALCIUM: 8.8 mg/dL — AB (ref 8.9–10.3)
CO2: 20 mmol/L — ABNORMAL LOW (ref 22–32)
CREATININE: 0.85 mg/dL (ref 0.61–1.24)
Chloride: 103 mmol/L (ref 101–111)
GFR calc Af Amer: >60 mL/min (ref >60)
Glucose, Bld: 98 mg/dL (ref 65–99)
Potassium: 4.1 mmol/L (ref 3.5–5.1)
SODIUM: 138 mmol/L (ref 135–145)

## 2016-10-19 LAB — I-STAT TROPONIN, ED: Troponin i, poc: 0 ng/mL (ref 0.00–0.08)

## 2016-10-19 LAB — BRAIN NATRIURETIC PEPTIDE: B NATRIURETIC PEPTIDE 5: 27.6 pg/mL (ref 0.0–100.0)

## 2016-10-19 MED ORDER — OXYCODONE-ACETAMINOPHEN 5-325 MG PO TABS
1.0000 | ORAL_TABLET | Freq: Once | ORAL | Status: AC
  Administered 2016-10-19: 1 via ORAL
  Filled 2016-10-19: qty 1

## 2016-10-19 MED ORDER — IBUPROFEN 600 MG PO TABS: 600.0000 mg | ORAL_TABLET | Freq: Four times a day (QID) | ORAL | 0 refills | Status: DC | PRN

## 2016-10-19 MED ORDER — FUROSEMIDE 20 MG PO TABS: 20.0000 mg | ORAL_TABLET | Freq: Every day | ORAL | 0 refills | Status: DC

## 2016-10-19 MED ORDER — FUROSEMIDE 20 MG PO TABS
20.0000 mg | ORAL_TABLET | Freq: Once | ORAL | Status: AC
  Administered 2016-10-19: 20 mg via ORAL
  Filled 2016-10-19: qty 1

## 2016-10-19 MED ORDER — ALBUTEROL SULFATE (2.5 MG/3ML) 0.083% IN NEBU
2.5000 mg | INHALATION_SOLUTION | Freq: Once | RESPIRATORY_TRACT | Status: AC
  Administered 2016-10-19: 2.5 mg via RESPIRATORY_TRACT
  Filled 2016-10-19: qty 3

## 2016-10-19 NOTE — ED Triage Notes (Signed)
Pt in c/o SOB over the last year intermittently, pt has swelling to L leg, pt seen at [facility] yesterday & told to come here for eval for CHF, pt reports swelling to both legs weekly, pt A&O x4

## 2016-10-19 NOTE — ED Notes (Signed)
VASCULAR TECH AWARE OF PATIENT 

## 2016-10-19 NOTE — Discharge Instructions (Signed)
Please read and follow all provided instructions.  Your diagnoses today include:  1. Peripheral edema   2. Acute pain of left knee     Tests performed today include: Vital signs. See below for your results today.   Medications prescribed:  Take as prescribed   Home care instructions:  Follow any educational materials contained in this packet.  Follow-up instructions: Please follow-up with your primary care provider for further evaluation of symptoms and treatment   Return instructions:  Please return to the Emergency Department if you do not get better, if you get worse, or new symptoms OR  - Fever (temperature greater than 101.41F)  - Bleeding that does not stop with holding pressure to the area    -Severe pain (please note that you may be more sore the day after your accident)  - Chest Pain  - Difficulty breathing  - Severe nausea or vomiting  - Inability to tolerate food and liquids  - Passing out  - Skin becoming red around your wounds  - Change in mental status (confusion or lethargy)  - New numbness or weakness    Please return if you have any other emergent concerns.  Additional Information:  Your vital signs today were: BP (!) 147/104 (BP Location: Right Arm)    Pulse 93    Temp 98 F (36.7 C) (Oral)    Resp 18    SpO2 97%  If your blood pressure (BP) was elevated above 135/85 this visit, please have this repeated by your doctor within one month. ---------------

## 2016-10-19 NOTE — ED Provider Notes (Signed)
[facility] Provider Note   CSN: [id number] Arrival date & time: 10/19/16  1047     History   Chief Complaint Chief Complaint  Patient presents with  . Shortness of Breath  . Leg Swelling    HPI Randy Mann is a 41 y.o. male.  HPI  41 y.o. male, presents to the Emergency Department today due to shortness of breath over the last year. Pt states that he went to a [facility] clinic yesterday and was told to come to ED for potential CHF etiology. Notes dyspnea with exertion. Orthopnea at night that requires 3 pillows to sleep adequately. No chest pain. No N/V. No diaphoresis. Notes swelling in legs never truly goes down. No fevers. No cough/congestion. Pt does endorse smoking daily. No hx CHF. No hx Asthma or respiratory issues. Does not take medications regularly. Pt also endorses left knee pain. This occurred x3-4 days ago. Occurred at work. No direct trauma to area. TTP bilateral knee joint. No swelling. No meds PTA. No other symptoms noted.   History reviewed. No pertinent past medical history.  There are no active problems to display for this patient.   Past Surgical History:  Procedure Laterality Date  . ABDOMINAL SURGERY         Home Medications    Prior to Admission medications   Medication Sig Start Date End Date Taking? Authorizing Provider  azithromycin (ZITHROMAX) 250 MG tablet Take 1 tablet (250 mg total) by mouth daily. Take first 2 tablets together, then 1 every day until finished. 06/24/14   Kirichenko, Tatyana, PA-C  naproxen (NAPROSYN) 500 MG tablet Take 1 tablet (500 mg total) by mouth 2 (two) times daily. 06/24/14   Kirichenko, Lemont Fillers, PA-C  predniSONE (DELTASONE) 5 MG tablet 6 tablets po q d x 2 days, then 5 tabs po x 2 days, then 4 tabs po x 2 days, then 3 tabs po x 2 days, then 2 tabs po x 2 days then 1 tab po x 2 days then stop 12/13/15   Riki Sheer, PA-C    Family History No family history on file.  Social History Social History    Substance Use Topics  . Smoking status: Current Every Day Smoker    Packs/day: 1.00  . Smokeless tobacco: Never Used  . Alcohol use 25.2 oz/week    42 Cans of beer per week     Allergies   Patient has no known allergies.   Review of Systems Review of Systems ROS reviewed and all are negative for acute change except as noted in the HPI.  Physical Exam Updated Vital Signs BP 123/82 (BP Location: Right Arm)   Pulse 95   Temp 98 F (36.7 C) (Oral)   Resp 14   SpO2 95%   Physical Exam  Constitutional: He is oriented to person, place, and time. He appears well-developed and well-nourished. No distress.  HENT:  Head: Normocephalic and atraumatic.  Right Ear: Tympanic membrane, external ear and ear canal normal.  Left Ear: Tympanic membrane, external ear and ear canal normal.  Nose: Nose normal.  Mouth/Throat: Uvula is midline, oropharynx is clear and moist and mucous membranes are normal. No trismus in the jaw. No oropharyngeal exudate, posterior oropharyngeal erythema or tonsillar abscesses.  Eyes: Pupils are equal, round, and reactive to light. EOM are normal.  Neck: Normal range of motion. Neck supple. No tracheal deviation present.  Cardiovascular: Normal rate, regular rhythm, S1 normal, S2 normal, normal heart sounds, intact distal pulses and normal  pulses.   Pulmonary/Chest: Effort normal. No respiratory distress. He has no decreased breath sounds. He has wheezes in the right upper field, the right lower field, the left upper field and the left lower field. He has no rhonchi. He has no rales.  Abdominal: Normal appearance and bowel sounds are normal. There is no tenderness.  Musculoskeletal: Normal range of motion.  BLE pitting edema 2+. Goes below knee. NVI. No erythema or signs of infection   Neurological: He is alert and oriented to person, place, and time.  Skin: Skin is warm and dry.  Psychiatric: He has a normal mood and affect. His speech is normal and behavior is  normal. Thought content normal.     ED Treatments / Results  Labs (all labs ordered are listed, but only abnormal results are displayed) Labs Reviewed  BASIC METABOLIC PANEL - Abnormal; Notable for the following:       Result Value   CO2 20 (*)    BUN 5 (*)    Calcium 8.8 (*)    All other components within normal limits  CBC  BRAIN NATRIURETIC PEPTIDE  I-STAT TROPONIN, ED    EKG  EKG Interpretation None       Radiology Dg Chest 2 View  Result Date: 10/19/2016 CLINICAL DATA:  Lower leg swelling for 1/2 years EXAM: CHEST  2 VIEW COMPARISON:  01/13/2016 FINDINGS: There is moderate cardiac enlargement. No pleural effusion or edema. No airspace opacities. The visualized skeletal structures are unremarkable. IMPRESSION: 1. No acute cardiopulmonary abnormalities Electronically Signed   By: Signa Kell M.D.   On: 10/19/2016 12:38   Dg Knee 2 Views Left  Result Date: 10/19/2016 CLINICAL DATA:  Left knee pain/ swelling, no known injury EXAM: LEFT KNEE - 1-2 VIEW COMPARISON:  None. FINDINGS: No fracture or dislocation is seen. The joint spaces are preserved. Small suprapatellar knee joint effusion. IMPRESSION: Small suprapatellar knee effusion. Electronically Signed   By: Charline Bills M.D.   On: 10/19/2016 19:09    Procedures Procedures (including critical care time)  Medications Ordered in ED Medications  oxyCODONE-acetaminophen (PERCOCET/ROXICET) 5-325 MG per tablet 1 tablet (1 tablet Oral Given 10/19/16 1735)  albuterol (PROVENTIL) (2.5 MG/3ML) 0.083% nebulizer solution 2.5 mg (2.5 mg Nebulization Given 10/19/16 1735)     Initial Impression / Assessment and Plan / ED Course  I have reviewed the triage vital signs and the nursing notes.  Pertinent labs & imaging results that were available during my care of the patient were reviewed by me and considered in my medical decision making (see chart for details).  Final Clinical Impressions(s) / ED Diagnoses  {I have  reviewed and evaluated the relevant laboratory values. {I have reviewed and evaluated the relevant imaging studies. {I have interpreted the relevant EKG. {I have reviewed the relevant previous healthcare records.  {I obtained HPI from historian. {Patient discussed with supervising physician.  ED Course:  Assessment: Pt is a 41 y.o. male presents to the Emergency Department today due to shortness of breath over the last year. Pt states that he went to a [facility] clinic yesterday and was told to come to ED for potential CHF etiology. Notes dyspnea with exertion. Orthopnea at night that requires 3 pillows to sleep adequately. No chest pain. No N/V. No diaphoresis. Notes swelling in legs never truly goes down. No fevers. No cough/congestion. Pt does endorse smoking daily. No hx CHF. No hx Asthma or respiratory issues. Does not take medications regularly. Pt also endorses left knee  pain. This occurred x3-4 days ago. Occurred at work. No direct trauma to area. TTP bilateral knee joint. No swelling. No meds PTA. . On exam, pt in NAD. Nontoxic/nonseptic appearing. VSS. Afebrile. Lungs bilateral wheeze. BLE pitting edema 2+. Goes below knee. NVI. No erythema or signs of infection. Heart RRR. Abdomen nontender soft. Trop negative. EKG unremarkable. CXR without vascular congestion. BNP unremarkable. CBC/BMP unremarkable. Given neb treatment as well as Lasix in ED. Discussed with attending physician. DVT US negative. Knee imaging showed small suprapatellear effusion. No fracture. Given knee sleeve. Plan is to DC home with lasix. Likely dependant edema of legs. Doubt CHF. Given PCP resources. At time of discharge, Patient is in no acute distress. Vital Signs are stable. Patient is able to ambulate. Patient able to tolerate PO.   Disposition/Plan:  DC Home Additional Verbal discharge instructions given and discussed with patient.  Pt Instructed to f/u with PCP in the next week for evaluation and treatment of  symptoms. Return precautions given Pt acknowledges and agrees with plan  Supervising Physician Doug Sou, MD  Final diagnoses:  Peripheral edema  Acute pain of left knee    New Prescriptions New Prescriptions   No medications on file     Audry Pili, Cordelia Poche 10/19/16 1912    Doug Sou, MD 10/20/16 [phone]

## 2016-10-19 NOTE — ED Notes (Signed)
ED Provider at bedside. 

## 2016-10-19 NOTE — Progress Notes (Signed)
**  Preliminary report by tech**  Bilateral lower extremity venous duplex completed. There is no evidence of deep or superficial vein thrombosis involving the right and left lower extremities. All visualized vessels appear patent and compressible. There is no evidence of Baker's cysts bilaterally. Results were given to Audry Pili PA.  10/19/16 6:23 PM Olen Cordial RVT

## 2016-10-19 NOTE — ED Provider Notes (Signed)
Patient reports bilateral leg swelling for 1 year, he reports that his left leg became more swollen 3 days ago. He complains of dyspnea however is unchanged for the past one year. He denies any chest pain denies fever. He does admit to nonproductive cough which is chronic. Dyspnea is worse with exertion and improved withrest. Unchanged over the past year. Other associated symptoms include nonproductive cough for one year. No fever. No orthopnea. On exam patient is in no distress alert. HEENT exam no facial asymmetry neck supple no JVD lungs clear breath sounds heart regular rate and rhythm abdomen obese nontender bilateral lower extremities with 1+ pretibial pitting edema. DP pulses 2+ bilaterally. Good capillary refill. Upper extremity is without edema or neurovascular intact chest x-ray viewed by me   Doug Sou, MD 10/20/16 0010

## 2017-04-23 ENCOUNTER — Encounter (HOSPITAL_COMMUNITY): Payer: Self-pay | Admitting: Emergency Medicine

## 2017-04-23 ENCOUNTER — Ambulatory Visit (HOSPITAL_COMMUNITY)
Admission: EM | Admit: 2017-04-23 | Discharge: 2017-04-23 | Disposition: A | Discharge status: Home / Self Care | Payer: Self-pay

## 2017-04-23 ENCOUNTER — Other Ambulatory Visit: Payer: Self-pay

## 2017-04-23 DIAGNOSIS — M2550 Pain in unspecified joint: Secondary | ICD-10-CM

## 2017-04-23 DIAGNOSIS — I10 Essential (primary) hypertension: Secondary | ICD-10-CM

## 2017-04-23 DIAGNOSIS — G8929 Other chronic pain: Secondary | ICD-10-CM

## 2017-04-23 DIAGNOSIS — M25521 Pain in right elbow: Secondary | ICD-10-CM

## 2017-04-23 MED ORDER — AMLODIPINE BESYLATE 5 MG PO TABS: 5.0000 mg | ORAL_TABLET | Freq: Every day | ORAL | 0 refills | Status: DC

## 2017-04-23 MED ORDER — MELOXICAM 15 MG PO TABS: 15.0000 mg | ORAL_TABLET | Freq: Every day | ORAL | 0 refills | Status: DC

## 2017-04-23 MED ORDER — DOXYCYCLINE HYCLATE 100 MG PO CAPS: 100.0000 mg | ORAL_CAPSULE | Freq: Two times a day (BID) | ORAL | 0 refills | Status: AC

## 2017-04-23 MED ORDER — COLCHICINE 0.6 MG PO TABS: ORAL_TABLET | ORAL | 0 refills | Status: DC

## 2017-04-23 NOTE — Discharge Instructions (Addendum)
Go ahead and start taking the doxycycline today and make sure to complete the course.  Start the colchicine today and repeat tomorrow if needed.  Please drink lots of water throughout the medication regimen.  If at any point you begin to have fever, sweats then please come back.  If at any point you become dizzy, lethargic, with fever and please go to the emergency department.

## 2017-04-23 NOTE — ED Triage Notes (Signed)
Bilateral leg/joint aches and pains for 2 years.  Now, pain is continuous.  Patient reports intermittently swelling.    Right elbow is red and swollen and warm to touch.  Patient concerned for a insect bite.  Onset of right elbow pain 5 days ago

## 2017-04-23 NOTE — ED Provider Notes (Signed)
04/23/2017 1:31 PM   DOB: 1975-05-17 / MRN: [id number]  SUBJECTIVE:  Randy Mann is a 42 y.o. male presenting for acute right elbow pain.  Patient reports redness and throbbing.  He denies fever and chills.  This is been present for 4 days and is worsening.  Complains of "rheumatoid arthritis."  He is never had any labs to prove this.  Complains of chronic knee and ankle pain.  He does have a primary care provider and is willing to go back to discuss these problems further.  He has No Known Allergies.   He  has no past medical history on file.    He  reports that he has been smoking.  He has been smoking about 1.00 pack per day. He has never used smokeless tobacco. He reports that he drank about 25.2 oz of alcohol per week. He reports that he does not use drugs. He  has no sexual activity history on file. The patient  has a past surgical history that includes Abdominal surgery.  His family history includes Asthma in his mother; Cancer in his mother; Diabetes in his mother; Hypertension in his mother.  Review of Systems  Constitutional: Negative for chills, diaphoresis and fever.  Eyes: Negative.   Respiratory: Negative for cough, hemoptysis, sputum production, shortness of breath and wheezing.   Cardiovascular: Negative for chest pain, orthopnea and leg swelling.  Gastrointestinal: Negative for nausea.  Skin: Negative for rash.  Neurological: Negative for dizziness, sensory change, speech change, focal weakness and headaches.    OBJECTIVE:  BP (!) 157/98 (BP Location: Left Arm) Comment: Notified Kim  Pulse 91   Temp 98.3 F (36.8 C) (Oral)   Resp 20   SpO2 97%   BP Readings from Last 3 Encounters:  04/23/17 (!) 157/98  10/19/16 134/86  01/21/16 116/64   Lab Results  Component Value Date   CREATININE 0.85 10/19/2016   Wt Readings from Last 3 Encounters:  12/08/15 257 lb (116.6 kg)  06/24/14 250 lb (113.4 kg)     Physical Exam  Constitutional: He appears  well-developed. He is active and cooperative.  Non-toxic appearance.  Cardiovascular: Normal rate.  Pulmonary/Chest: Effort normal. No tachypnea.  Musculoskeletal:       Arms: Neurological: He is alert.  Skin: Skin is warm and dry. He is not diaphoretic. No pallor.  Vitals reviewed.   No results found for this or any previous visit (from the past 72 hour(s)).  No results found.  ASSESSMENT AND PLAN:  No orders of the defined types were placed in this encounter.    Elbow pain, right: Bursitis early versus gout.  He is self-pay.  I will treat him for both.  For his chronic knee pain I am starting him on meloxicam 10 mg.  There is elevated blood pressure starting on a medium dose of Norvasc and advising him to get back to his primary care provider within the next 30 days.  Chronic joint pain  Uncontrolled hypertension      The patient is advised to call or return to clinic if he does not see an improvement in symptoms, or to seek the care of the closest emergency department if he worsens with the above plan.   Deliah BostonMichael Clark, MHS, PA-C 04/23/2017 1:31 PM    Ofilia Neaslark, Michael L, PA-C 04/23/17 1331

## 2018-01-06 ENCOUNTER — Emergency Department
Admission: EM | Admit: 2018-01-06 | Discharge: 2018-01-06 | Disposition: A | Discharge status: Home / Self Care | Payer: Self-pay | Attending: Emergency Medicine | Admitting: Emergency Medicine

## 2018-01-06 ENCOUNTER — Emergency Department: Payer: Self-pay

## 2018-01-06 ENCOUNTER — Other Ambulatory Visit: Payer: Self-pay

## 2018-01-06 DIAGNOSIS — Z79899 Other long term (current) drug therapy: Secondary | ICD-10-CM | POA: Insufficient documentation

## 2018-01-06 DIAGNOSIS — R0602 Shortness of breath: Secondary | ICD-10-CM | POA: Insufficient documentation

## 2018-01-06 DIAGNOSIS — R079 Chest pain, unspecified: Secondary | ICD-10-CM | POA: Insufficient documentation

## 2018-01-06 DIAGNOSIS — F17200 Nicotine dependence, unspecified, uncomplicated: Secondary | ICD-10-CM | POA: Insufficient documentation

## 2018-01-06 LAB — HEPATIC FUNCTION PANEL
ALT: 49 U/L — ABNORMAL HIGH (ref 0–44)
AST: 33 U/L (ref 15–41)
Albumin: 3.8 g/dL (ref 3.5–5.0)
Alkaline Phosphatase: 75 U/L (ref 38–126)
TOTAL PROTEIN: 7.2 g/dL (ref 6.5–8.1)
Total Bilirubin: 0.6 mg/dL (ref 0.3–1.2)

## 2018-01-06 LAB — BASIC METABOLIC PANEL
Anion gap: 13 (ref 5–15)
BUN: 7 mg/dL (ref 6–20)
CO2: 23 mmol/L (ref 22–32)
CREATININE: 0.75 mg/dL (ref 0.61–1.24)
Calcium: 9 mg/dL (ref 8.9–10.3)
Chloride: 100 mmol/L (ref 98–111)
Glucose, Bld: 116 mg/dL — ABNORMAL HIGH (ref 70–99)
POTASSIUM: 4.1 mmol/L (ref 3.5–5.1)
SODIUM: 136 mmol/L (ref 135–145)

## 2018-01-06 LAB — TSH: TSH: 1.888 u[IU]/mL (ref 0.350–4.500)

## 2018-01-06 LAB — SEDIMENTATION RATE: Sed Rate: 5 mm/hr (ref 0–15)

## 2018-01-06 LAB — CBC
HCT: 46.1 % (ref 39.0–52.0)
Hemoglobin: 15.5 g/dL (ref 13.0–17.0)
MCH: 30.6 pg (ref 26.0–34.0)
MCHC: 33.6 g/dL (ref 30.0–36.0)
MCV: 91.1 fL (ref 80.0–100.0)
NRBC: 0 % (ref 0.0–0.2)
Platelets: 400 10*3/uL (ref 150–400)
RBC: 5.06 MIL/uL (ref 4.22–5.81)
RDW: 13.2 % (ref 11.5–15.5)
WBC: 8.3 10*3/uL (ref 4.0–10.5)

## 2018-01-06 LAB — FIBRIN DERIVATIVES D-DIMER (ARMC ONLY): Fibrin derivatives D-dimer (ARMC): 910.7 ng/mL (FEU) — ABNORMAL HIGH (ref 0.00–499.00)

## 2018-01-06 LAB — BRAIN NATRIURETIC PEPTIDE: B NATRIURETIC PEPTIDE 5: 16 pg/mL (ref 0.0–100.0)

## 2018-01-06 LAB — TROPONIN I

## 2018-01-06 MED ORDER — IOHEXOL 350 MG/ML SOLN
75.0000 mL | Freq: Once | INTRAVENOUS | Status: AC | PRN
  Administered 2018-01-06: 75 mL via INTRAVENOUS

## 2018-01-06 NOTE — Discharge Instructions (Addendum)
Please return here for worse pain, fever, vomiting or shortness of breath.  Please call Dr. Okey DupreEnd, the cardiologist and schedule a follow-up appointment.  Call his office on Monday morning.,  Let the office know that you were in the emergency room with chest pain  He should be out of see Monday or Tuesday.  Please then also follow-up with primary care.  You can try [facility] or the [facility] or the [facility] or the [facility] or the [facility] or [facility] or the [facility].  When you get the appointment let them know that we did a bunch of rheumatology labs here in the ER today, they should be done to access them on the computer.  Remember to cut back on your smoking and drinking.  Try to get a little bit of exercise once you seen the cardiologist.  That is more exercise than you get work.  Walking, swimming, biking and that sort of low impact exercise will help a lot with your weight as long as you also eat just a little bit less.

## 2018-01-06 NOTE — ED Notes (Signed)
Pt taken to room and hooked to monitor. RN Vikki PortsValerie called and informed of pt being roomed.

## 2018-01-06 NOTE — ED Triage Notes (Addendum)
Pt comes via POV from home with c/o chest pain. Pt states this started about 3 weeks ago but it went away. Pt states recently it started hurting more and has been constant the last 2 days.  Pt states left sided chest pain that radiates to his left arm. Pt c/o SHOB. Pt states 8/10 pain. Pt states more pain when he coughs.  Pt also states productive cough with white phlegm. Pt states he has noticed fluid buildup in left elbow and left knee and ankles. Pt states increased weight gain.  Pt smokes a pack a day.

## 2018-01-06 NOTE — ED Notes (Signed)
Family given coffee

## 2018-01-06 NOTE — ED Notes (Signed)
Returned from CT.

## 2018-01-06 NOTE — ED Provider Notes (Signed)
[facility] Emergency Department Provider Note   ____________________________________________   First MD Initiated Contact with Patient 01/06/18 1139     (approximate)  I have reviewed the triage vital signs and the nursing notes.   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  He reports she was sitting in his truck about 2 weeks ago and got a sharp pain in his left chest which lasted about half an hour.  That went away but now he is having more pains in his left chest to come and go.  Started having the last pain 2 days ago is been there constantly for 2 days.  Is causing him to be short of breath.  He gets increasing short of breath with walking.  He also has some worse chest pain when he walks or breathes.  Patient also complains of swelling in his left elbow and his left knee.  He has achy joints in the morning can barely get out of bed because his feet hurt and his toes are numb.  This is been getting worse for a while.  Patient says he was supposed to go see a rheumatologist but did not have the money to do it he has no insurance.  Patient does smoke and drink.  He works in [employer].   History reviewed. No pertinent past medical history.  There are no active problems to display for this patient.   Past Surgical History:  Procedure Laterality Date  . ABDOMINAL SURGERY      Prior to Admission medications   Medication Sig Start Date End Date Taking? Authorizing Provider  Cyanocobalamin (VITAMIN B 12 PO) Take 1 tablet by mouth daily.    Yes [provider]  ibuprofen (ADVIL,MOTRIN) 200 MG tablet Take 200 mg by mouth every 6 (six) hours as needed.   Yes [provider]  meloxicam (MOBIC) 15 MG tablet Take 1 tablet (15 mg total) by mouth daily. For chronic joint pain. 04/23/17  Yes Ofilia Neas, PA-C    Allergies Patient has no known allergies.  Family History    Problem Relation Age of Onset  . Asthma Mother   . Cancer Mother   . Diabetes Mother   . Hypertension Mother     Social History Social History   Tobacco Use  . Smoking status: Current Every Day Smoker    Packs/day: 1.00  . Smokeless tobacco: Never Used  Substance Use Topics  . Alcohol use: Yes    Alcohol/week: 42.0 standard drinks    Types: 42 Cans of beer per week  . Drug use: No    Review of Systems  Constitutional: No fever/chills Eyes: No visual changes. ENT: No sore throat. Cardiovascular:  chest pain. Respiratory: some shortness of breath. Gastrointestinal: No abdominal pain.  No nausea, no vomiting.  No diarrhea.  No constipation. Genitourinary: Negative for dysuria. Musculoskeletal: Negative for back pain. Skin: Negative for rash. Neurological: Negative for headaches, focal weakness   ____________________________________________   PHYSICAL EXAM:  VITAL SIGNS: ED Triage Vitals  Enc Vitals Group     BP 01/06/18 1114 (!) 135/91     Pulse Rate 01/06/18 1114 80     Resp 01/06/18 1114 18     Temp 01/06/18 1114 97.7 F (36.5 C)     Temp Source 01/06/18 1114 Oral     SpO2 01/06/18 1114 97 %     Weight 01/06/18 1112 300  lb (136.1 kg)     Height 01/06/18 1112 6\' 3"  (1.905 m)     Head Circumference --      Peak Flow --      Pain Score 01/06/18 1112 8     Pain Loc --      Pain Edu? --      Excl. in GC? --     Constitutional: Alert and oriented. Well appearing and in no acute distress. Eyes: Conjunctivae are normal.  Head: Atraumatic. Nose: No congestion/rhinnorhea. Mouth/Throat: Mucous membranes are moist.  Oropharynx non-erythematous. Neck: No stridor.  Cardiovascular: Normal rate, regular rhythm. Grossly normal heart sounds.  Good peripheral circulation. Respiratory: Normal respiratory effort.  No retractions. Lungs CTAB. Gastrointestinal: Soft and nontender. No distention. No abdominal bruits. No CVA tenderness. Musculoskeletal: No lower extremity  tenderness nor edema.  Patient has a swollen BURSA ON THE LEFT ELBOW AND PREPATELLAR BURSA ON THE LEFT KNEE.  THEY ARE NOT RED OR INFLAMED OR NOT HOT THEY ARE NOT TENDER BUT THEY ARE SWOLLEN.  THERE DOES NOT APPEAR TO BE A JOINT EFFUSION IN EITHER JOINT.  Not appear to have any joint swelling in his other joints either to include the PIP and DIP joints and the MCP joints and hands Neurologic:  Normal speech and language. No gross focal neurologic deficits are appreciated.  Skin:  Skin is warm, dry and intact. No rash noted. Psychiatric: Mood and affect are normal. Speech and behavior are normal.  ____________________________________________   LABS (all labs ordered are listed, but only abnormal results are displayed)  Labs Reviewed  BASIC METABOLIC PANEL - Abnormal; Notable for the following components:      Result Value   Glucose, Bld 116 (*)    All other components within normal limits  HEPATIC FUNCTION PANEL - Abnormal; Notable for the following components:   ALT 49 (*)    All other components within normal limits  FIBRIN DERIVATIVES D-DIMER ([facility] ONLY) - Abnormal; Notable for the following components:   Fibrin derivatives D-dimer ([facility]) 910.70 (*)    All other components within normal limits  CBC  TROPONIN I  BRAIN NATRIURETIC PEPTIDE  SEDIMENTATION RATE  TSH  ANTINUCLEAR ANTIBODIES, IFA  RHEUMATOID FACTOR  ANTI-DNA ANTIBODY, DOUBLE-STRANDED  ANTIPHOSPHOLIPID SYNDROME EVAL, BLD   ____________________________________________  EKG  EKG read interpreted by me shows normal sinus rhythm rate of 86 normal axis no acute ST-T changes ____________________________________________  RADIOLOGY  ED MD interpretation:   Official radiology report(s): Dg Chest 2 View  Result Date: 01/06/2018 CLINICAL DATA:  42 year old male with left-sided chest pain EXAM: CHEST - 2 VIEW COMPARISON:  Prior chest x-ray 10/19/2016 FINDINGS: The lungs are clear and negative for focal airspace  consolidation, pulmonary edema or suspicious pulmonary nodule. No pleural effusion or pneumothorax. Cardiac and mediastinal contours are within normal limits. No acute fracture or lytic or blastic osseous lesions. The visualized upper abdominal bowel gas pattern is unremarkable. Remote healed/healing fracture of the posterolateral aspect of the right eighth and ninth ribs. IMPRESSION: No active cardiopulmonary disease. Electronically Signed   By: Malachy MoanHeath  McCullough M.D.   On: 01/06/2018 12:57   Ct Angio Chest Pe W And/or Wo Contrast  Result Date: 01/06/2018 CLINICAL DATA:  Acute presentation with chest pain beginning about 3 weeks ago, intermittent. Left-sided pain radiates to the left arm. Shortness of breath. EXAM: CT ANGIOGRAPHY CHEST WITH CONTRAST TECHNIQUE: Multidetector CT imaging of the chest was performed using the standard protocol during bolus administration of intravenous contrast. Multiplanar CT  image reconstructions and MIPs were obtained to evaluate the vascular anatomy. CONTRAST:  75mL OMNIPAQUE IOHEXOL 350 MG/ML SOLN COMPARISON:  Chest radiography same day. FINDINGS: Cardiovascular: Pulmonary arterial opacification is moderate to good. No pulmonary emboli are seen. No aortic atherosclerosis or coronary artery calcification is seen. Mediastinum/Nodes: There is a well-circumscribed ovoid mass in the anterior mediastinum with density measurements of 44 Hounsfield units and diameter of 4 cm. No other mediastinal mass or any enlarged lymph nodes. 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 However, this cannot be conclusively differentiated from thymoma at this time. Thymic cysts are often of higher density than cysts in other parts of the body. Follow-up study in 3 months is suggested to ensure stability. Lungs/Pleura: The lungs are clear. No pleural fluid. Upper Abdomen: Fatty change of the liver. No focal lesion. No other upper abdominal finding.  Musculoskeletal: Normal Review of the MIP images confirms the above findings. IMPRESSION: 1. No pulmonary emboli or other acute chest pathology. Lungs are clear. 2. 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 Thymoma is not excluded, but thymic cysts often show higher density than other simple cysts. Follow-up recommended. See above discussion. Electronically Signed   By: Paulina Fusi M.D.   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  Sed rate is normal rest of his lab work does not show anything particularly worrisome.  He will follow-up with cardiology for his chest pain and attempt to get primary care for his other problems and then follow-up with them and/or rheumatology.  I have ordered a number of rheumatological test to help possibly delineate why he is having his stiffness and achiness.  As I explained to him and his significant other they will not be back in the next day or 2 but all of the people I referred him to for primary care should be able to access the computer system and get the results there.  Additionally assess significant other pointed out the test will be available on the my chart.         ____________________________________________   FINAL CLINICAL IMPRESSION(S) / ED DIAGNOSES  Final diagnoses:  Chest pain, unspecified type     ED Discharge Orders    None       Note:  This document was prepared using Dragon voice recognition software and may include unintentional dictation errors.    Arnaldo Natal,  MD 01/06/18 [phone]

## 2018-01-08 LAB — ANTINUCLEAR ANTIBODIES, IFA: ANA Ab, IFA: NEGATIVE

## 2018-01-08 LAB — RHEUMATOID FACTOR: Rheumatoid fact SerPl-aCnc: <10 IU/mL (ref 0.0–13.9)

## 2018-01-08 LAB — ANTI-DNA ANTIBODY, DOUBLE-STRANDED: ds DNA Ab: 3 IU/mL (ref 0–9)

## 2018-01-11 LAB — ANTIPHOSPHOLIPID SYNDROME EVAL, BLD
Anticardiolipin IgG: <9 GPL U/mL (ref 0–14)
Anticardiolipin IgM: 14 MPL U/mL — ABNORMAL HIGH (ref 0–12)
DRVVT: 41.4 s (ref 0.0–47.0)
PHOSPHATYDALSERINE, IGA: 6 {APS'U} (ref 0–20)
PTT Lupus Anticoagulant: 33.4 s (ref 0.0–51.9)
Phosphatydalserine, IgG: 3 GPS IgG (ref 0–11)
Phosphatydalserine, IgM: 62 MPS IgM — ABNORMAL HIGH (ref 0–25)

## 2019-07-15 ENCOUNTER — Ambulatory Visit (INDEPENDENT_AMBULATORY_CARE_PROVIDER_SITE_OTHER): Payer: Self-pay

## 2019-07-15 ENCOUNTER — Other Ambulatory Visit: Payer: Self-pay

## 2019-07-15 ENCOUNTER — Ambulatory Visit
Admission: EM | Admit: 2019-07-15 | Discharge: 2019-07-15 | Disposition: A | Discharge status: Home / Self Care | Payer: Self-pay | Attending: Family Medicine | Admitting: Family Medicine

## 2019-07-15 DIAGNOSIS — R400 Somnolence: Secondary | ICD-10-CM

## 2019-07-15 DIAGNOSIS — R5383 Other fatigue: Secondary | ICD-10-CM

## 2019-07-15 DIAGNOSIS — R29818 Other symptoms and signs involving the nervous system: Secondary | ICD-10-CM

## 2019-07-15 NOTE — Discharge Instructions (Signed)
Follow up with sleep specialist

## 2019-07-15 NOTE — ED Provider Notes (Signed)
[facility]    CSN: [id number] Arrival date & time: 07/15/19  1939      History   Chief Complaint Chief Complaint  Patient presents with  . Fatigue    HPI Randy Mann is a 44 y.o. male.   44 yo male with a c/o fatigue and shortness of breath for several weeks. Also c/o being sleepy a lot during the day. Denies any fevers, chills, wheezing, chest pains but has had a mild cough.  Patient states he's concerned about pneumonia because he had it in the past with similar symptoms.     History reviewed. No pertinent past medical history.  There are no problems to display for this patient.   Past Surgical History:  Procedure Laterality Date  . ABDOMINAL SURGERY         Home Medications    Prior to Admission medications   Medication Sig Start Date End Date Taking? Authorizing Provider  ibuprofen (ADVIL,MOTRIN) 200 MG tablet Take 200 mg by mouth every 6 (six) hours as needed.   Yes [provider]  Cyanocobalamin (VITAMIN B 12 PO) Take 1 tablet by mouth daily.     [provider]  meloxicam (MOBIC) 15 MG tablet Take 1 tablet (15 mg total) by mouth daily. For chronic joint pain. 04/23/17   Ofilia Neas, PA-C    Family History Family History  Problem Relation Age of Onset  . Asthma Mother   . Cancer Mother   . Diabetes Mother   . Hypertension Mother     Social History Social History   Tobacco Use  . Smoking status: Current Every Day Smoker    Packs/day: 1.00  . Smokeless tobacco: Never Used  Vaping Use  . Vaping Use: Never used  Substance Use Topics  . Alcohol use: Yes    Alcohol/week: 42.0 standard drinks    Types: 42 Cans of beer per week  . Drug use: No     Allergies   Patient has no known allergies.   Review of Systems Review of Systems   Physical Exam Triage Vital Signs ED Triage Vitals  Enc Vitals Group     BP 07/15/19 2002 128/85     Pulse Rate 07/15/19 2002 89     Resp 07/15/19 2002 18     Temp  07/15/19 2002 98 F (36.7 C)     Temp Source 07/15/19 2002 Oral     SpO2 07/15/19 2002 95 %     Weight 07/15/19 2000 (!) 315 lb (142.9 kg)     Height 07/15/19 2000 6\' 4"  (1.93 m)     Head Circumference --      Peak Flow --      Pain Score 07/15/19 2000 8     Pain Loc --      Pain Edu? --      Excl. in GC? --    No data found.  Updated Vital Signs BP 128/85 (BP Location: Left Arm)   Pulse 89   Temp 98 F (36.7 C) (Oral)   Resp 18   Ht 6\' 4"  (1.93 m)   Wt (!) 142.9 kg   SpO2 95%   BMI 38.34 kg/m   Visual Acuity Right Eye Distance:   Left Eye Distance:   Bilateral Distance:    Right Eye Near:   Left Eye Near:    Bilateral Near:     Physical Exam Vitals and nursing note reviewed.  Constitutional:      General: He  is not in acute distress.    Appearance: He is not toxic-appearing or diaphoretic.  Cardiovascular:     Rate and Rhythm: Normal rate.     Heart sounds: Normal heart sounds.  Pulmonary:     Effort: Pulmonary effort is normal. No respiratory distress.     Breath sounds: Normal breath sounds. No stridor. No wheezing, rhonchi or rales.  Neurological:     Mental Status: He is alert.      UC Treatments / Results  Labs (all labs ordered are listed, but only abnormal results are displayed) Labs Reviewed - No data to display  EKG   Radiology DG Chest 2 View  Result Date: 07/15/2019 CLINICAL DATA:  Shortness of breath. EXAM: CHEST - 2 VIEW COMPARISON:  01/06/2018 FINDINGS: Stable cardiac enlargement. No pleural effusion or edema. No airspace densities identified. Right posterior rib deformities appear chronic. IMPRESSION: Cardiac enlargement.  No acute abnormality Electronically Signed   By: Kerby Moors M.D.   On: 07/15/2019 20:33    Procedures Procedures (including critical care time)  Medications Ordered in UC Medications - No data to display  Initial Impression / Assessment and Plan / UC Course  I have reviewed the triage vital signs and the  nursing notes.  Pertinent labs & imaging results that were available during my care of the patient were reviewed by me and considered in my medical decision making (see chart for details).      Final Clinical Impressions(s) / UC Diagnoses   Final diagnoses:  Fatigue, unspecified type  Daytime somnolence  Suspected sleep apnea    ED Prescriptions    None      1. Chest x-ray results and diagnosis reviewed with patient 2. Recommend follow up with sleep specialist for further evaluation and management 3. Follow-up prn  PDMP not reviewed this encounter.   Norval Gable, MD 07/15/19 2117

## 2019-07-15 NOTE — ED Triage Notes (Signed)
Patient complains fatigue and shortness of breath x multiple weeks. Patient states that he has episodes where he is riding in a car and falls asleep. States that he has also noticed a cough and pain in his lower back.

## 2019-09-16 ENCOUNTER — Emergency Department: Payer: Self-pay

## 2019-09-16 ENCOUNTER — Other Ambulatory Visit: Payer: Self-pay

## 2019-09-16 ENCOUNTER — Emergency Department
Admission: EM | Admit: 2019-09-16 | Discharge: 2019-09-16 | Disposition: A | Discharge status: Home / Self Care | Payer: Self-pay | Attending: Emergency Medicine | Admitting: Emergency Medicine

## 2019-09-16 DIAGNOSIS — F17210 Nicotine dependence, cigarettes, uncomplicated: Secondary | ICD-10-CM | POA: Insufficient documentation

## 2019-09-16 DIAGNOSIS — J029 Acute pharyngitis, unspecified: Secondary | ICD-10-CM | POA: Insufficient documentation

## 2019-09-16 DIAGNOSIS — B349 Viral infection, unspecified: Secondary | ICD-10-CM

## 2019-09-16 DIAGNOSIS — Z20822 Contact with and (suspected) exposure to covid-19: Secondary | ICD-10-CM | POA: Insufficient documentation

## 2019-09-16 DIAGNOSIS — R5383 Other fatigue: Secondary | ICD-10-CM | POA: Insufficient documentation

## 2019-09-16 DIAGNOSIS — J449 Chronic obstructive pulmonary disease, unspecified: Secondary | ICD-10-CM | POA: Insufficient documentation

## 2019-09-16 HISTORY — DX: Chronic obstructive pulmonary disease, unspecified: J44.9

## 2019-09-16 LAB — RESPIRATORY PANEL BY RT PCR (FLU A&B, COVID)
Influenza A by PCR: NEGATIVE
Influenza B by PCR: NEGATIVE
SARS Coronavirus 2 by RT PCR: NEGATIVE

## 2019-09-16 MED ORDER — GUAIFENESIN-CODEINE 100-10 MG/5ML PO SYRP: 5.0000 mL | ORAL_SOLUTION | Freq: Three times a day (TID) | ORAL | 0 refills | Status: DC | PRN

## 2019-09-16 MED ORDER — PREDNISONE 10 MG PO TABS: 50.0000 mg | ORAL_TABLET | Freq: Every day | ORAL | 0 refills | Status: DC

## 2019-09-16 NOTE — ED Provider Notes (Signed)
[facility] Emergency Department Provider Note  ____________________________________________  Time seen: Approximately 4:08 PM  I have reviewed the triage vital signs and the nursing notes.   HISTORY  Chief Complaint Cough and Sore Throat   HPI Randy Mann is a 44 y.o. male presents to the emergency department for treatment and evaluation of cough, scratchy throat and fatigue. Symptoms started yesterday. No fever. Girlfriend diagnosed with COVID-19 3 days ago. No relief with tylenol/ibuprofen.   Past Medical History:  Diagnosis Date   COPD (chronic obstructive pulmonary disease) (HCC)     There are no problems to display for this patient.   Past Surgical History:  Procedure Laterality Date   ABDOMINAL SURGERY      Prior to Admission medications   Medication Sig Start Date End Date Taking? Authorizing Provider  Cyanocobalamin (VITAMIN B 12 PO) Take 1 tablet by mouth daily.     [provider]  guaiFENesin-codeine (ROBITUSSIN AC) 100-10 MG/5ML syrup Take 5 mLs by mouth 3 (three) times daily as needed for cough. 09/16/19   Jayzen Paver B, FNP  predniSONE (DELTASONE) 10 MG tablet Take 5 tablets (50 mg total) by mouth daily. 09/16/19   Chinita Pester, FNP    Allergies Patient has no known allergies.  Family History  Problem Relation Age of Onset   Asthma Mother    Cancer Mother    Diabetes Mother    Hypertension Mother     Social History Social History   Tobacco Use   Smoking status: Current Every Day Smoker    Packs/day: 1.00   Smokeless tobacco: Never Used  Vaping Use   Vaping Use: Never used  Substance Use Topics   Alcohol use: Yes    Alcohol/week: 42.0 standard drinks    Types: 42 Cans of beer per week   Drug use: No    Review of Systems Constitutional: Negative for fever/chills. Normal appetite. ENT: Positive for sore throat. Cardiovascular: Denies chest pain. Respiratory: Negative for shortness of  breath. Positive for cough. Negative wheezing.  Gastrointestinal: no nausea,  no vomiting.  no diarrhea.  Musculoskeletal: Positive for body aches Skin: Negative for rash. Neurological: Positive for headaches ____________________________________________   PHYSICAL EXAM:  VITAL SIGNS: ED Triage Vitals  Enc Vitals Group     BP 09/16/19 0733 (!) 171/93     Pulse Rate 09/16/19 0733 75     Resp 09/16/19 0733 19     Temp 09/16/19 0733 98 F (36.7 C)     Temp Source 09/16/19 0733 Oral     SpO2 09/16/19 0733 98 %     Weight 09/16/19 0733 (!) 316 lb (143.3 kg)     Height 09/16/19 0733 6\' 3"  (1.905 m)     Head Circumference --      Peak Flow --      Pain Score 09/16/19 0736 5     Pain Loc --      Pain Edu? --      Excl. in [place]? --     Constitutional: Alert and oriented. Well appearing and in no acute distress. Eyes: Conjunctivae are normal. Ears: TM normal Nose: No sinus congestion noted; no rhinnorhea. Mouth/Throat: Mucous membranes are moist.  Oropharynx erythematous. Tonsils flat. Uvula midline. Neck: No stridor.  Lymphatic: No cervical lymphadenopathy. Cardiovascular: Normal rate, regular rhythm. Good peripheral circulation. Respiratory: Respirations are even and unlabored.  No retractions. Breath sounds clear. Gastrointestinal: Soft and nontender.  Musculoskeletal: FROM x 4 extremities.  Neurologic:  Normal speech  and language. Skin:  Skin is warm, dry and intact. No rash noted. Psychiatric: Mood and affect are normal. Speech and behavior are normal.  ____________________________________________   LABS (all labs ordered are listed, but only abnormal results are displayed)  Labs Reviewed  RESPIRATORY PANEL BY RT PCR (FLU A&B, COVID)   ____________________________________________  EKG  Not indicated. ____________________________________________  RADIOLOGY  CXR negative for cardiopulmonary  abnormalities. ____________________________________________   PROCEDURES  Procedure(s) performed: None  Critical Care performed: No ____________________________________________   INITIAL IMPRESSION / ASSESSMENT AND PLAN / ED COURSE  44 y.o. male who presents to the emergency department for COVID-19 testing and treatment. No evidence of pneumonia on CXR. He will be treated with prednisone and Robitussin AC. He is to follow up with PCP or return to the ER for symptoms that do not improve.    Medications - No data to display  ED Discharge Orders         Ordered    predniSONE (DELTASONE) 10 MG tablet  Daily     Discontinue  Reprint     09/16/19 1016    guaiFENesin-codeine (ROBITUSSIN AC) 100-10 MG/5ML syrup  3 times daily PRN     Discontinue  Reprint     09/16/19 1016           Pertinent labs & imaging results that were available during my care of the patient were reviewed by me and considered in my medical decision making (see chart for details).    If controlled substance prescribed during this visit, 12 month history viewed on the [facility] prior to issuing an initial prescription for Schedule II or III opiod. ____________________________________________   FINAL CLINICAL IMPRESSION(S) / ED DIAGNOSES  Final diagnoses:  Acute viral syndrome    Note:  This document was prepared using Dragon voice recognition software and may include unintentional dictation errors.    Chinita Pester, FNP 09/16/19 1613    Jene Every, MD 09/17/19 1357

## 2019-09-16 NOTE — ED Triage Notes (Signed)
Pt comes via POV from home with c/o sore throat cough and fatigue. Pt states girlfriend dx with COVID on Friday.  Pt denies any CP or SOB.

## 2019-09-16 NOTE — ED Notes (Signed)
See triage note  Presents with dry cough,scratchy throat and fatigue   sxs' started yesterday  No fever and is currently afebrile on arrival

## 2020-12-02 IMAGING — CR DG CHEST 2V
3 series · 4 of 4 positions shown · non-contrast
Comparison: 01/06/2018

CLINICAL DATA: Shortness of breath.

EXAM:
CHEST - 2 VIEW

[Series 1: chest pa · 0.14mm/px · 2 of 2 slices shown (1 of 2)]
[im 1/2]
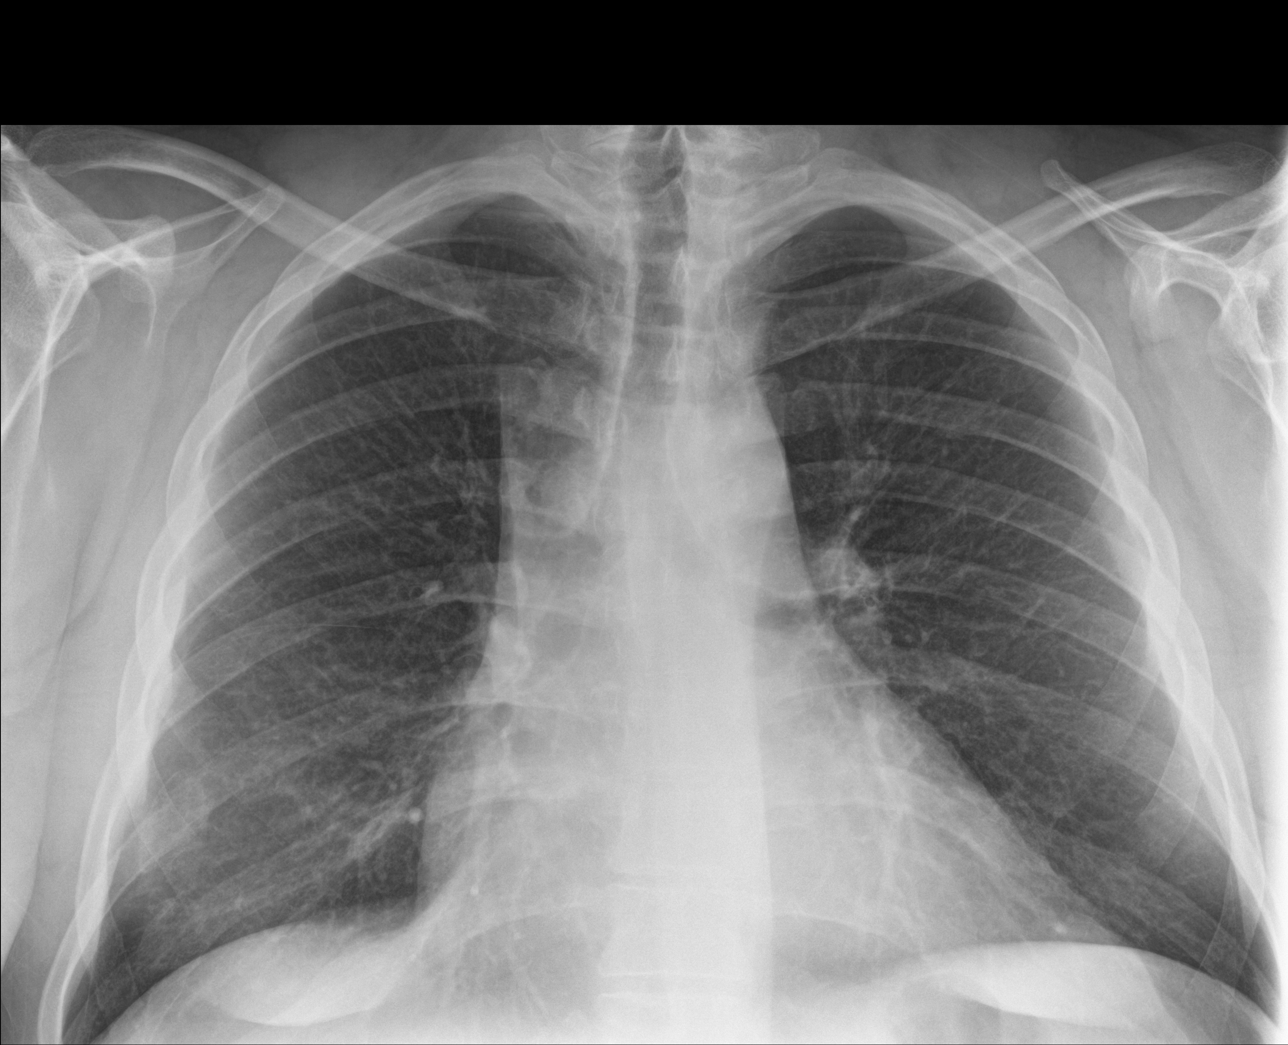
[im 2/2]
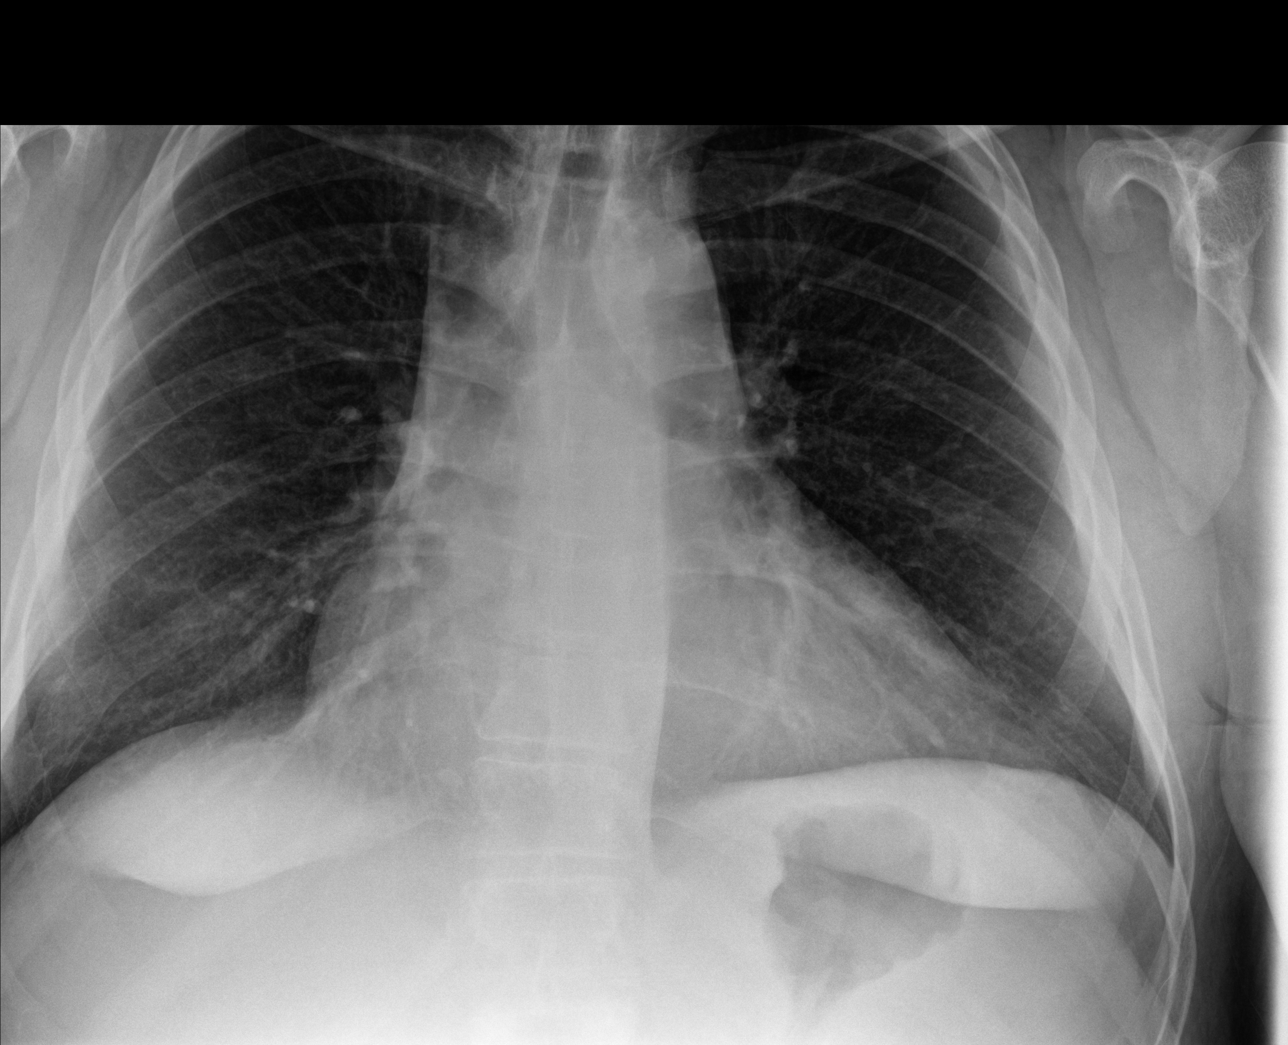

[chest lat]
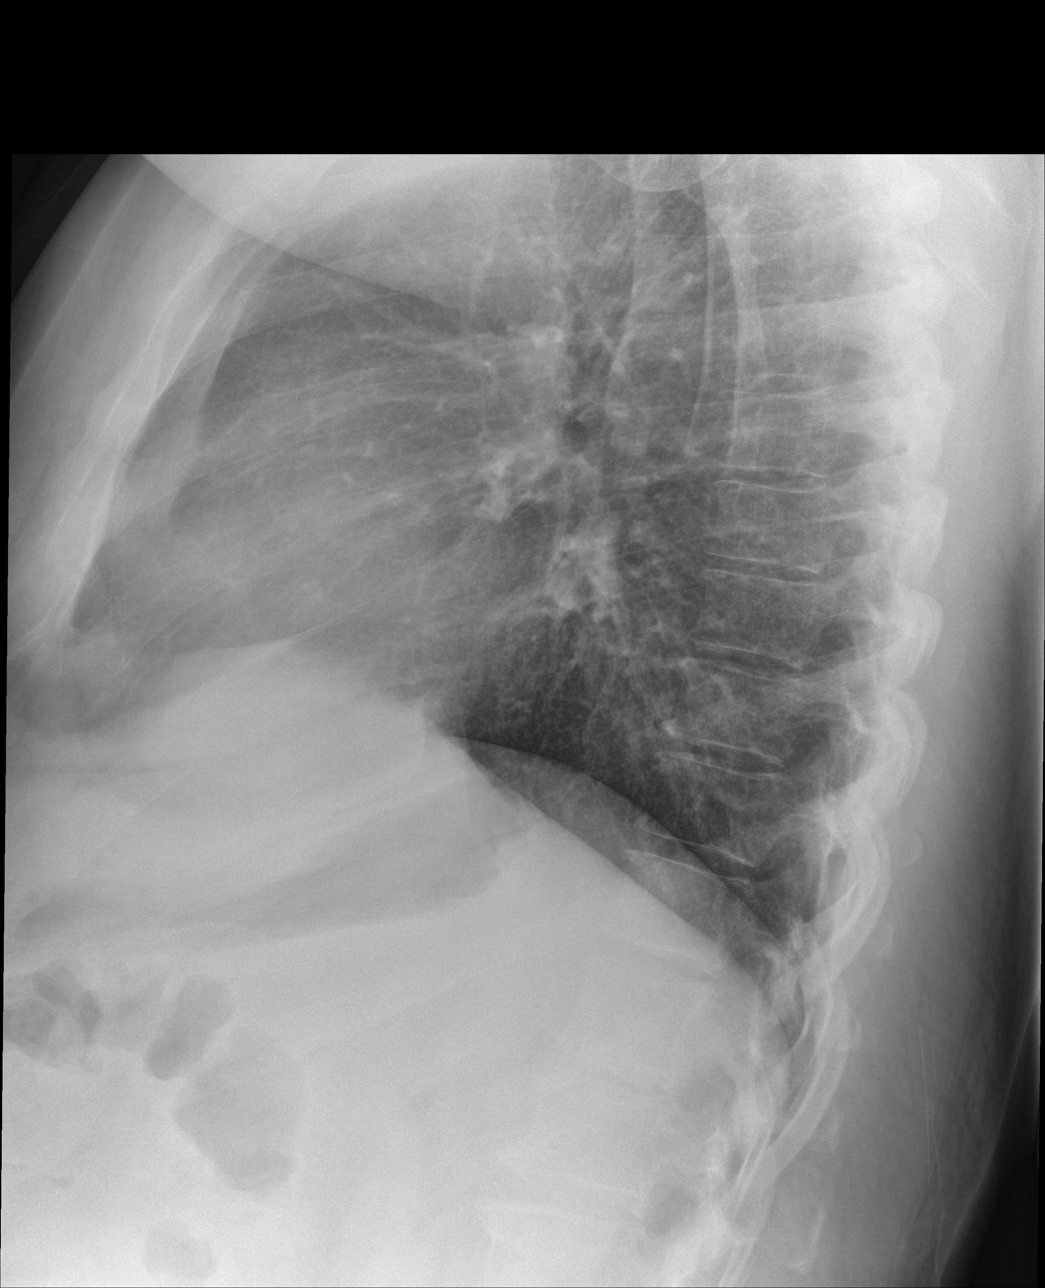

[chest pa (2 of 2)]
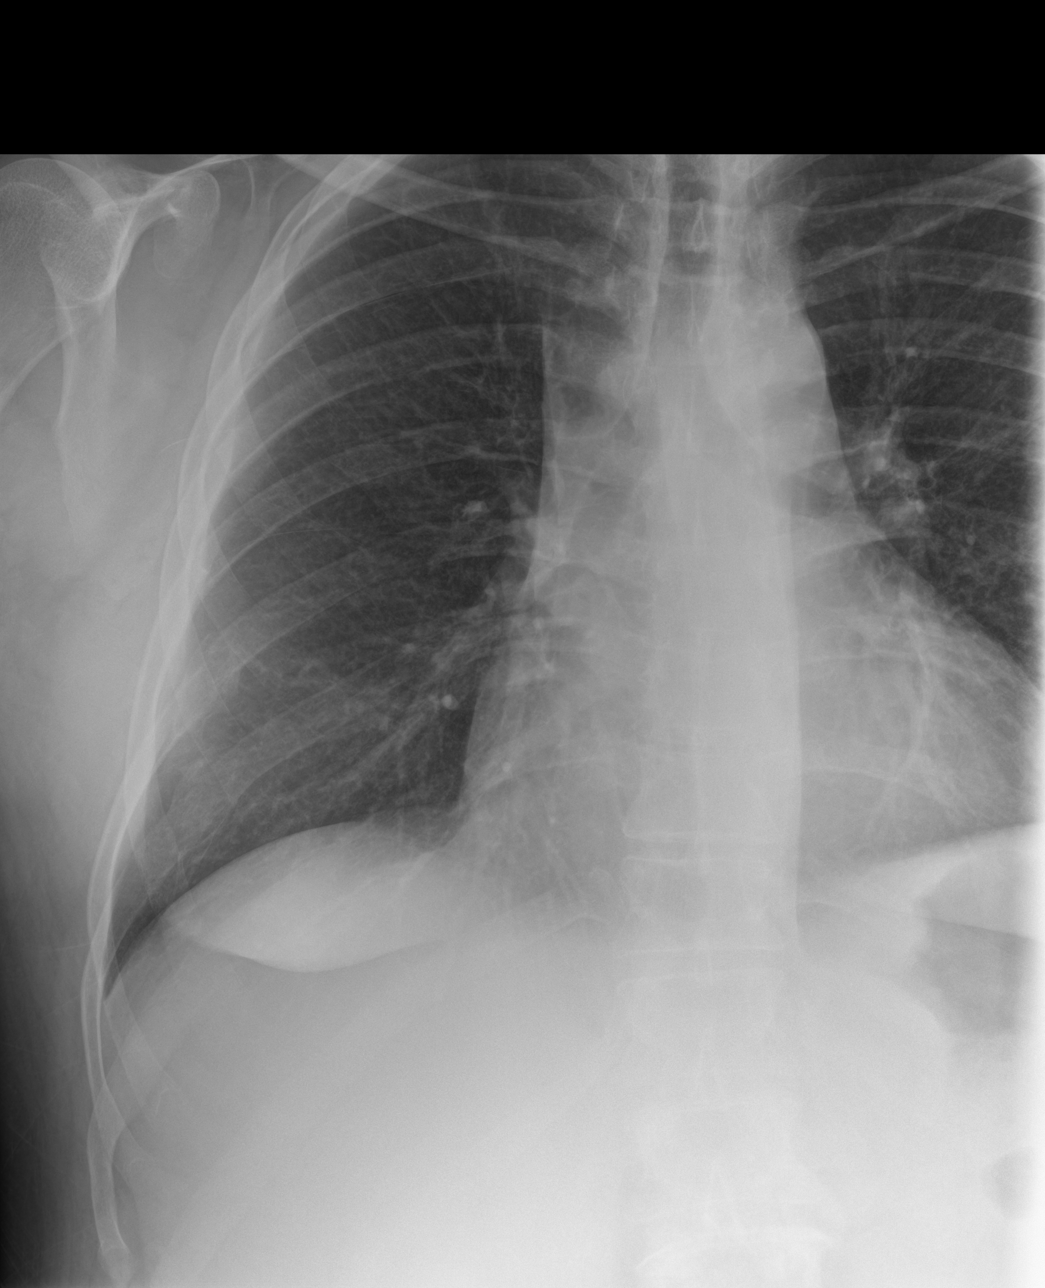

[4 of 4 positions shown; findings below may reference images not displayed]

FINDINGS: Stable cardiac enlargement. No pleural effusion or edema. No
airspace densities identified. Right posterior rib deformities
appear chronic.
IMPRESSION: Cardiac enlargement.  No acute abnormality

## 2021-11-11 ENCOUNTER — Ambulatory Visit
Admission: EM | Admit: 2021-11-11 | Discharge: 2021-11-11 | Disposition: A | Discharge status: Home / Self Care | Payer: Medicaid Other | Attending: Emergency Medicine | Admitting: Emergency Medicine

## 2021-11-11 DIAGNOSIS — Z1152 Encounter for screening for COVID-19: Secondary | ICD-10-CM | POA: Insufficient documentation

## 2021-11-11 DIAGNOSIS — J441 Chronic obstructive pulmonary disease with (acute) exacerbation: Secondary | ICD-10-CM

## 2021-11-11 DIAGNOSIS — R509 Fever, unspecified: Secondary | ICD-10-CM

## 2021-11-11 HISTORY — DX: Polyneuropathy, unspecified: G62.9

## 2021-11-11 HISTORY — DX: Unspecified osteoarthritis, unspecified site: M19.90

## 2021-11-11 LAB — RESP PANEL BY RT-PCR (RSV, FLU A&B, COVID)  RVPGX2
Influenza A by PCR: NEGATIVE
Influenza B by PCR: NEGATIVE
Resp Syncytial Virus by PCR: NEGATIVE
SARS Coronavirus 2 by RT PCR: NEGATIVE

## 2021-11-11 MED ORDER — PREDNISONE 10 MG PO TABS: 40.0000 mg | ORAL_TABLET | Freq: Every day | ORAL | 0 refills | Status: AC

## 2021-11-11 NOTE — Discharge Instructions (Addendum)
Your COVID, Flu, and RSV tests are pending.    Use your an albuterol inhaler and take the prednisone as directed.  Follow-up with your primary care provider if your symptoms are not improving.

## 2021-11-11 NOTE — ED Triage Notes (Signed)
Patient to Urgent Care with complaints of nasal congestion, cough, fatigue and fevers. Symptoms started 1.5 days ago. Max temp reported 101.2.  Patient also complaining of a knot present to the right side of his groin. Reports the pain radiates down the inside of his leg and is tender.

## 2021-11-11 NOTE — ED Provider Notes (Signed)
Randy Mann    CSN: [id number] Arrival date & time: 11/11/21  1805      History   Chief Complaint Chief Complaint  Patient presents with   Nasal Congestion   Cough   Groin Pain    HPI Randy Mann is a 46 y.o. male.  Patient presents with 1 day history of fever, fatigue, congestion, cough.  He also reports a tender "knot" in his right upper thigh. No sore throat, wheezing, shortness of breath, vomiting, diarrhea, abdominal pain, dysuria, hematuria, penile discharge, testicular pain, or other symptoms.  No OTC medications taken today.  He has not required his albuterol inhaler today.  His medical history includes COPD.    The history is provided by the patient, the spouse and medical records.    Past Medical History:  Diagnosis Date   Arthritis    COPD (chronic obstructive pulmonary disease) ([place])    Neuropathy     Patient Active Problem List   Diagnosis Date Noted   Fever, unspecified 11/11/2021    Past Surgical History:  Procedure Laterality Date   ABDOMINAL SURGERY         Home Medications    Prior to Admission medications   Medication Sig Start Date End Date Taking? Authorizing Provider  predniSONE (DELTASONE) 10 MG tablet Take 4 tablets (40 mg total) by mouth daily for 5 days. 11/11/21 11/16/21 Yes Sharion Balloon, NP  allopurinol (ZYLOPRIM) 100 MG tablet Take 100 mg by mouth daily. 11/03/21   [provider]  Cyanocobalamin (VITAMIN B 12 PO) Take 1 tablet by mouth daily.     [provider]  gabapentin (NEURONTIN) 300 MG capsule Take 300 mg by mouth 3 (three) times daily. 10/30/21   [provider]  naproxen (NAPROSYN) 500 MG tablet Take 500 mg by mouth 2 (two) times daily. 10/26/21   [provider]    Family History Family History  Problem Relation Age of Onset   Asthma Mother    Cancer Mother    Diabetes Mother    Hypertension Mother     Social History Social History   Tobacco Use   Smoking status:  Every Day    Packs/day: 1.00    Types: Cigarettes   Smokeless tobacco: Never  Vaping Use   Vaping Use: Never used  Substance Use Topics   Alcohol use: Not Currently    Alcohol/week: 42.0 standard drinks of alcohol    Types: 42 Cans of beer per week   Drug use: No     Allergies   Patient has no known allergies.   Review of Systems Review of Systems  Constitutional:  Positive for fever. Negative for chills.  HENT:  Positive for congestion. Negative for ear pain and sore throat.   Respiratory:  Positive for cough. Negative for shortness of breath.   Cardiovascular:  Negative for chest pain and palpitations.  Gastrointestinal:  Negative for abdominal pain, diarrhea, nausea and vomiting.  Genitourinary:  Negative for dysuria, hematuria, penile discharge and testicular pain.  Skin:  Negative for color change and rash.  All other systems reviewed and are negative.    Physical Exam Triage Vital Signs ED Triage Vitals  Enc Vitals Group     BP      Pulse      Resp      Temp      Temp src      SpO2      Weight      Height  Head Circumference      Peak Flow      Pain Score      Pain Loc      Pain Edu?      Excl. in [place]?    No data found.  Updated Vital Signs BP 127/78   Pulse 75   Temp 98.1 F (36.7 C)   Resp 18   Ht 6\' 4"  (1.93 m)   Wt 245 lb (111.1 kg)   SpO2 97%   BMI 29.82 kg/m   Visual Acuity Right Eye Distance:   Left Eye Distance:   Bilateral Distance:    Right Eye Near:   Left Eye Near:    Bilateral Near:     Physical Exam Vitals and nursing note reviewed.  Constitutional:      General: He is not in acute distress.    Appearance: Normal appearance. He is well-developed. He is not ill-appearing.  HENT:     Right Ear: Tympanic membrane normal.     Left Ear: Tympanic membrane normal.     Nose: Nose normal.     Mouth/Throat:     Mouth: Mucous membranes are moist.     Pharynx: Oropharynx is clear.  Cardiovascular:     Rate and Rhythm:  Normal rate and regular rhythm.     Heart sounds: Normal heart sounds.  Pulmonary:     Effort: Pulmonary effort is normal. No respiratory distress.     Breath sounds: Normal breath sounds.  Abdominal:     General: Bowel sounds are normal.     Palpations: Abdomen is soft.     Tenderness: There is no abdominal tenderness. There is no guarding or rebound.  Musculoskeletal:     Cervical back: Neck supple.  Skin:    General: Skin is warm and dry.     Comments: Mass in right upper thigh near groin which appears to be a lipoma.  No open wounds.  No inguinal lymphadenopathy.    Neurological:     Mental Status: He is alert.  Psychiatric:        Mood and Affect: Mood normal.      UC Treatments / Results  Labs (all labs ordered are listed, but only abnormal results are displayed) Labs Reviewed  RESP PANEL BY RT-PCR (RSV, FLU A&B, COVID)  [id number]    EKG   Radiology No results found.  Procedures Procedures (including critical care time)  Medications Ordered in UC Medications - No data to display  Initial Impression / Assessment and Plan / UC Course  I have reviewed the triage vital signs and the nursing notes.  Pertinent labs & imaging results that were available during my care of the patient were reviewed by me and considered in my medical decision making (see chart for details).   Fever, COPD exacerbation.  No OTC medications taken today.  Afebrile and vital signs are stable.  O2 sat 97% on room air.  COVID, Flu, RSV pending.  Treating with prednisone.  Instructed patient to use albuterol inhaler as directed.  Discussed symptomatic treatment including Tylenol or ibuprofen, rest, hydration.  Instructed patient to follow up with his PCP if his symptoms are not improving.  He agrees to plan of care.    Final Clinical Impressions(s) / UC Diagnoses   Final diagnoses:  Fever, unspecified  COPD exacerbation ([place])     Discharge Instructions      Your COVID, Flu, and RSV tests  are pending.    Use your an albuterol  inhaler and take the prednisone as directed.  Follow-up with your primary care provider if your symptoms are not improving.        ED Prescriptions     Medication Sig Dispense Auth. Provider   predniSONE (DELTASONE) 10 MG tablet Take 4 tablets (40 mg total) by mouth daily for 5 days. 20 tablet Sharion Balloon, NP      PDMP not reviewed this encounter.   Sharion Balloon, NP 11/11/21 [phone]
